# Patient Record
Sex: Female | Born: 1990 | Race: Asian | Hispanic: No | Marital: Married | Smoking: Never smoker
Health system: Southern US, Community
[De-identification: ages and names within clinical notes are randomized; demographics above are authoritative.]

## PROBLEM LIST (undated history)

## (undated) DIAGNOSIS — Z789 Other specified health status: Secondary | ICD-10-CM

## (undated) HISTORY — PX: NO PAST SURGERIES: SHX2092

## (undated) HISTORY — DX: Other specified health status: Z78.9

---

## 2015-09-05 LAB — OB RESULTS CONSOLE RPR: RPR: NONREACTIVE

## 2015-09-05 LAB — OB RESULTS CONSOLE ABO/RH: RH Type: POSITIVE

## 2015-09-05 LAB — CYTOLOGY - PAP: PAP SMEAR: NEGATIVE

## 2015-09-05 LAB — CYSTIC FIBROSIS DIAGNOSTIC STUDY: INTERPRETATION-CFDNA: NEGATIVE

## 2015-09-05 LAB — OB RESULTS CONSOLE GC/CHLAMYDIA
CHLAMYDIA, DNA PROBE: NEGATIVE
Gonorrhea: NEGATIVE

## 2015-09-05 LAB — GLUCOSE TOLERANCE, 1 HOUR: GLUCOSE 1 HOUR GTT: 98

## 2015-09-05 LAB — OB RESULTS CONSOLE ANTIBODY SCREEN: ANTIBODY SCREEN: NEGATIVE

## 2015-09-05 LAB — OB RESULTS CONSOLE HIV ANTIBODY (ROUTINE TESTING): HIV: NONREACTIVE

## 2015-09-05 LAB — OB RESULTS CONSOLE HEPATITIS B SURFACE ANTIGEN: HEP B S AG: NEGATIVE

## 2015-09-05 LAB — OB RESULTS CONSOLE PLATELET COUNT: Platelets: 255 10*3/uL

## 2015-09-05 LAB — SICKLE CELL SCREEN: SICKLE CELL SCREEN: NEGATIVE

## 2015-09-05 LAB — CULTURE, OB URINE: URINE CULTURE, OB: NEGATIVE

## 2015-09-05 LAB — OB RESULTS CONSOLE RUBELLA ANTIBODY, IGM: Rubella: IMMUNE

## 2015-09-05 LAB — OB RESULTS CONSOLE HGB/HCT, BLOOD
HCT: 37 %
HEMOGLOBIN: 12.7 g/dL

## 2015-09-05 LAB — OB RESULTS CONSOLE VARICELLA ZOSTER ANTIBODY, IGG: VARICELLA IGG: NON-IMMUNE/NOT IMMUNE

## 2015-10-11 ENCOUNTER — Encounter: Payer: Self-pay | Admitting: *Deleted

## 2015-10-11 LAB — AMB REFERRAL TO OB-GYN: PAP SMEAR: NEGATIVE

## 2015-10-16 ENCOUNTER — Ambulatory Visit (INDEPENDENT_AMBULATORY_CARE_PROVIDER_SITE_OTHER): Payer: Self-pay | Admitting: Family Medicine

## 2015-10-16 ENCOUNTER — Encounter: Payer: Self-pay | Admitting: Family Medicine

## 2015-10-16 VITALS — BP 125/64 | HR 86 | Ht 61.0 in | Wt 115.0 lb

## 2015-10-16 DIAGNOSIS — O30049 Twin pregnancy, dichorionic/diamniotic, unspecified trimester: Secondary | ICD-10-CM

## 2015-10-16 DIAGNOSIS — O0990 Supervision of high risk pregnancy, unspecified, unspecified trimester: Secondary | ICD-10-CM | POA: Insufficient documentation

## 2015-10-16 DIAGNOSIS — O0992 Supervision of high risk pregnancy, unspecified, second trimester: Secondary | ICD-10-CM

## 2015-10-16 DIAGNOSIS — O30042 Twin pregnancy, dichorionic/diamniotic, second trimester: Secondary | ICD-10-CM

## 2015-10-16 LAB — POCT URINALYSIS DIP (DEVICE)
Bilirubin Urine: NEGATIVE
GLUCOSE, UA: NEGATIVE mg/dL
Hgb urine dipstick: NEGATIVE
Ketones, ur: NEGATIVE mg/dL
LEUKOCYTES UA: NEGATIVE
NITRITE: NEGATIVE
PROTEIN: NEGATIVE mg/dL
SPECIFIC GRAVITY, URINE: 1.02 (ref 1.005–1.030)
UROBILINOGEN UA: 0.2 mg/dL (ref 0.0–1.0)
pH: 6.5 (ref 5.0–8.0)

## 2015-10-16 NOTE — Progress Notes (Signed)
New OB Note  10/16/2015   Clinic:  [facility]  Chief Complaint:  None  Transfer of Care Patient: yes  History of Present Illness: Jenna Walton is a 25 y.o. G1P0000 @ 20 1/7 weeks (EDC 03/03/16, based on LMP.  Patient's last menstrual period was 05/28/2015 (exact date).), with the above CC. Preg complicated by twin gestation.  Her periods were: regular periods every 28-30 days and last menstrual period 05/28/2015 She was using no method when she conceived.  She has Negative signs or symptoms of nausea/vomiting of pregnancy. She has Negative signs or symptoms of miscarriage or preterm labor She identifies recent move from [place], 5 months ago, discussed Zika risk factors for her and her partner  ROS: A 12-point review of systems was performed and negative, except as stated in the above HPI.  OBGYN History: As per HPI. OB History  Gravida Para Term Preterm AB Living  1 0 0 0 0 0  SAB TAB Ectopic Multiple Live Births  0 0 0 0 0    # Outcome Date GA Lbr Len/2nd Weight Sex Delivery Anes PTL Lv  1 Current               Any prior children are healthy, doing well, without any problems or issues: not applicable History of pap smears: Yes. Last pap smear 09/05/2015. Abnormal: no. History of STIs: No   Past Medical History: Past Medical History:  Diagnosis Date  . Medical history non-contributory     Past Surgical History: Past Surgical History:  Procedure Laterality Date  . NO PAST SURGERIES      Family History:  History reviewed. No pertinent family history. She denies any female cancers, bleeding or blood clotting disorders.  She denies any history of mental retardation, birth defects or genetic disorders in her or the FOB's history  Social History:  Social History   Social History  . Marital status: Married    Spouse name: N/A  . Number of children: N/A  . Years of education: N/A   Occupational History  . Not on file.   Social History Main Topics  . Smoking status: Never  Smoker  . Smokeless tobacco: Never Used  . Alcohol use No  . Drug use: No  . Sexual activity: Yes   Other Topics Concern  . Not on file   Social History Narrative  . No narrative on file   Any pets in the household: no   Allergy: No Known Allergies  Health Maintenance:  Mammogram Up to Date: not applicable  Current Outpatient Medications: Prenatal Vitamins  Physical Exam:   BP 125/64   Pulse 86   Ht  (1.549 m)   Wt 115 lb (52.2 kg)   LMP 05/28/2015 (Exact Date)   BMI 21.73 kg/m  Body mass index is 21.73 kg/m. Fundal height: 24 cm FHTs: A -147; B -138  General appearancS1, S2 normal, no murmur, rub or gallop, regular rate and rhythme: Well nourished, well developed female in no acute distress.  Neck:  Supple, normal appearance, and no thyromegaly  Cardiovascular:  Respiratory:  Clear to auscultation bilateral. Normal respiratory effort Abdomen: positive bowel sounds and no masses, hernias; diffusely non tender to palpation, non distended Breasts: breasts appear normal, no suspicious masses, no skin or nipple changes or axillary nodes. Neuro/Psych:  Normal mood and affect.  Skin:  Warm and dry.  Lymphatic:  No inguinal lymphadenopathy.   Pelvic exam: is not limited by body habitus EGBUS: within normal limits  Cervix: closed/long/high Uterus:  enlarged, c/w 20 week size, normal contour, and Adnexa:  normal adnexa  Laboratory: Please see chart  Imaging:  Found on pages 29 and 30 of the transfer packet DIAMNIOTIC/DICHORIONIC TWINS at gestational age 318 2/7 (A) - 5/7 (B) weeks ; EDD 03/07/16 (A) & 03/04/16 (B) LOW LYING PLACENTAS (BOTH) WITHIN 1 CM FROM CERVICAL OS Baby A- EFW 225.78g / 12.2%; Nuchal thickness 2mm; Normal anatomy - girl; Cephalic Baby B- EFW 258.49g / 42.3%; Nuchal thickness 3mm; Normal anatomy - girl ; Cephalic   Assessment/Plan:  1. Supervision of high risk pregnancy, antepartum, second trimester - POCT urinalysis dip (device) - OB  RESULTS CONSOLE GC/Chlamydia - OB RESULTS CONSOLE RPR - OB RESULTS CONSOLE HIV antibody - OB RESULTS CONSOLE Rubella Antibody - OB RESULTS CONSOLE Varicella zoster antibody, IgG - OB RESULTS CONSOLE Hepatitis B surface antigen - OB RESULTS CONSOLE ABO/Rh - OB RESULTS CONSOLE Antibody Screen - OB RESULTS CONSOLE Hemoglobin and hematocrit, blood - OB RESULTS CONSOLE PLATELET COUNT - Sickle cell screen - Cystic fibrosis diagnostic study - Glucose tolerance, 1 hour - Culture, OB Urine - Cytology - PAP - Prenatal Vit-Fe Fumarate-FA (MULTIVITAMIN-PRENATAL) 27-0.8 MG TABS tablet; Take 1 tablet by mouth daily at 12 noon. - AFP/Quad Scr  2. Dichorionic diamniotic twin pregancy - Repeat US at 24 weeks   Problem list reviewed and updated.  Follow up in 2 weeks.  >50% of 30 min visit spent on counseling and coordination of care.     Jenna ClarksElizabeth W. Mumaw, DO [facility] Fellow [facility](Faculty Practice)

## 2015-10-16 NOTE — Progress Notes (Signed)
Elenor LegatoWier Siu used for interpreter

## 2015-10-28 ENCOUNTER — Ambulatory Visit (INDEPENDENT_AMBULATORY_CARE_PROVIDER_SITE_OTHER): Payer: Self-pay | Admitting: Obstetrics & Gynecology

## 2015-10-28 VITALS — BP 108/60 | HR 79 | Wt 117.0 lb

## 2015-10-28 DIAGNOSIS — O30002 Twin pregnancy, unspecified number of placenta and unspecified number of amniotic sacs, second trimester: Secondary | ICD-10-CM

## 2015-10-28 DIAGNOSIS — O30009 Twin pregnancy, unspecified number of placenta and unspecified number of amniotic sacs, unspecified trimester: Secondary | ICD-10-CM

## 2015-10-28 LAB — POCT URINALYSIS DIP (DEVICE)
Bilirubin Urine: NEGATIVE
Glucose, UA: NEGATIVE mg/dL
HGB URINE DIPSTICK: NEGATIVE
Ketones, ur: NEGATIVE mg/dL
LEUKOCYTES UA: NEGATIVE
NITRITE: NEGATIVE
PH: 5.5 (ref 5.0–8.0)
Protein, ur: NEGATIVE mg/dL
SPECIFIC GRAVITY, URINE: 1.01 (ref 1.005–1.030)
UROBILINOGEN UA: 0.2 mg/dL (ref 0.0–1.0)

## 2015-10-28 NOTE — Patient Instructions (Signed)
Second Trimester of Pregnancy  The second trimester is from week 13 through week 28, month 4 through 6. This is often the time in pregnancy that you feel your best. Often times, morning sickness has lessened or quit. You may have more energy, and you may get hungry more often. Your unborn baby (fetus) is growing rapidly. At the end of the sixth month, he or she is about 9 inches long and weighs about 1½ pounds. You will likely feel the baby move (quickening) between 18 and 20 weeks of pregnancy.  HOME CARE   · Avoid all smoking, herbs, and alcohol. Avoid drugs not approved by your doctor.  · Do not use any tobacco products, including cigarettes, chewing tobacco, and electronic cigarettes. If you need help quitting, ask your doctor. You may get counseling or other support to help you quit.  · Only take medicine as told by your doctor. Some medicines are safe and some are not during pregnancy.  · Exercise only as told by your doctor. Stop exercising if you start having cramps.  · Eat regular, healthy meals.  · Wear a good support bra if your breasts are tender.  · Do not use hot tubs, steam rooms, or saunas.  · Wear your seat belt when driving.  · Avoid raw meat, uncooked cheese, and liter boxes and soil used by cats.  · Take your prenatal vitamins.  · Take 1500-2000 milligrams of calcium daily starting at the 20th week of pregnancy until you deliver your baby.  · Try taking medicine that helps you poop (stool softener) as needed, and if your doctor approves. Eat more fiber by eating fresh fruit, vegetables, and whole grains. Drink enough fluids to keep your pee (urine) clear or pale yellow.  · Take warm water baths (sitz baths) to soothe pain or discomfort caused by hemorrhoids. Use hemorrhoid cream if your doctor approves.  · If you have puffy, bulging veins (varicose veins), wear support hose. Raise (elevate) your feet for 15 minutes, 3-4 times a day. Limit salt in your diet.  · Avoid heavy lifting, wear low heals,  and sit up straight.  · Rest with your legs raised if you have leg cramps or low back pain.  · Visit your dentist if you have not gone during your pregnancy. Use a soft toothbrush to brush your teeth. Be gentle when you floss.  · You can have sex (intercourse) unless your doctor tells you not to.  · Go to your doctor visits.  GET HELP IF:   · You feel dizzy.  · You have mild cramps or pressure in your lower belly (abdomen).  · You have a nagging pain in your belly area.  · You continue to feel sick to your stomach (nauseous), throw up (vomit), or have watery poop (diarrhea).  · You have bad smelling fluid coming from your vagina.  · You have pain with peeing (urination).  GET HELP RIGHT AWAY IF:   · You have a fever.  · You are leaking fluid from your vagina.  · You have spotting or bleeding from your vagina.  · You have severe belly cramping or pain.  · You lose or gain weight rapidly.  · You have trouble catching your breath and have chest pain.  · You notice sudden or extreme puffiness (swelling) of your face, hands, ankles, feet, or legs.  · You have not felt the baby move in over an hour.  · You have severe headaches that do   not go away with medicine.  · You have vision changes.     This information is not intended to replace advice given to you by your health care provider. Make sure you discuss any questions you have with your health care provider.     Document Released: 05/20/2009 Document Revised: 03/16/2014 Document Reviewed: 04/26/2012  Elsevier Interactive Patient Education ©2016 Else[employer] Inc[employer]

## 2015-10-28 NOTE — Patient Instructions (Signed)
Multiple Pregnancy °Having a multiple pregnancy means you are carrying twins, triplets, or more. The majority of multiple pregnancies are twins. Naturally conceiving triplets or more (higher-order multiples) is quite rare. °Multiple pregnancies can sometimes be riskier than single pregnancies. You are more likely to have certain problems. For example, you are at higher risk for high blood pressure during pregnancy (preeclampsia). You may need to have more frequent appointments for prenatal care.  °CAUSES  °Sometimes your body releases more than one egg at a time. Having more than one egg fertilized by more than one sperm is the most common type of multiple pregnancy. Twins produced this way are fraternal. They are no more alike than other siblings. °Multiple pregnancies also result when one sperm fertilizes one egg and then that egg divides into more than one embryo. These are identical twins or triplets. Identical multiples are always the same gender and look very much alike. °RISK FACTORS °You may be at higher risk for having a multiple pregnancy if: °· You are older than 35. °· You have already had four or more children. °· You have a family history of multiple pregnancy. °· You have had fertility treatment. °SIGNS AND SYMPTOMS °Early signs and symptoms of multiple pregnancy include: °· Rapid weight gain in the first 3 months of pregnancy (first trimester). °· The uterus measuring larger than normal for your stage of pregnancy. °· More severe nausea. °· A higher-than-normal level of human chorionic gonadotropin (HCG). This is a hormone that your body produces in early pregnancy. °DIAGNOSIS  °Your health care provider may suspect a multiple pregnancy from your symptoms and a physical exam. The results of your HCG blood test may also suggest a multiple pregnancy. Your health care provider will confirm that you are carrying multiples by doing an ultrasound exam. This exam involves using sound waves and a computer to  get an image of the inside of your uterus. An ultrasound exam can confirm a multiple pregnancy after you have been pregnant for 6-8 weeks. °TREATMENT  °If you have a multiple pregnancy, you may need: °· Early screening for possible birth defects or genetic abnormalities. °· Frequent pelvic exams to check for signs of early labor. °· Frequent ultrasound exams during the second trimester. °· Frequent checks for high blood pressure and for protein in your urine. These are signs of preeclampsia. °· An anti-inflammatory medicine (corticosteroid) if you go into early labor. This medicine helps your babies' lungs mature. °· Magnesium sulfate to prevent cerebral palsy in your babies if you are expected to deliver before 32 weeks. °· Cesarean delivery if vaginal delivery is too dangerous. °HOME CARE INSTRUCTIONS  °Because your pregnancy may be considered high risk, you have to work closely with your health care team. You may also need to make some lifestyle changes. These may include the following: °· Increase your nutrition. °¨ Gaining about 40-50 lb (18-23 kg) is recommended when you are pregnant with multiples. Try to gain 1 lb (0.5 kg) a week for the first 20 weeks of your pregnancy. °¨ Your health care provider will let you know how many calories you should add to your diet each day. °¨ Eat healthy snacks often throughout the day. This can add calories and reduce nausea. °· Drink enough fluid to keep your urine clear or pale yellow. °· Take your prenatal vitamins. °· Take 1500-2000 mg of calcium daily starting at the 20th week of pregnancy until you deliver your babies. °· By 20-24 weeks, you may need to   limit your activities. °¨ Avoid strenuous activity and work. °¨ Ask your health care provider when you should stop having sexual intercourse. °¨ Rest often. °· Do not smoke. °· Do not drink alcohol. °· Arrange for extra help around the house. °· Keep all your prenatal appointments. °SEEK MEDICAL CARE IF: °· You have  dizziness. °· You have mild pelvic cramps, pelvic pressure, or nagging pain in the abdominal or low back area. °· You have persistent nausea, vomiting, or diarrhea. °· You have a bad smelling vaginal discharge. °· You have pain with urination. °· You are having trouble gaining weight. °· You notice increased swelling in your face, hands, legs, or ankles. °· You have a fever. °SEEK IMMEDIATE MEDICAL CARE IF:  °· You are leaking fluid from your vagina. °· You have spotting or bleeding from your vagina. °· You have severe abdominal cramping or pain. °· You have rapid weight gain or loss. °· You vomit blood or material that looks like coffee grounds. °· You are exposed to German measles and have never had them. °· You are exposed to fifth disease or chickenpox. °· You develop a severe headache. °· You have shortness of breath. °  °This information is not intended to replace advice given to you by your health care provider. Make sure you discuss any questions you have with your health care provider. °  °Document Released: 12/03/2007 Document Revised: 03/16/2014 Document Reviewed: 01/27/2013 °Elsevier Interactive Patient Education ©2016 Elsevier Inc. ° °

## 2015-10-28 NOTE — Progress Notes (Signed)
[place] interpreter [phone] used

## 2015-10-28 NOTE — Progress Notes (Signed)
Subjective:  Jenna Walton is a 25 y.o. G1P0000 at 7412w6d being seen today for ongoing prenatal care.  She is currently monitored for the following issues for this high-risk pregnancy and has Twin pregnancy, antepartum and Supervision of high risk pregnancy, antepartum on her problem list.  Patient reports no complaints.  Contractions: Not present. Vag. Bleeding: None.  Movement: Present. Denies leaking of fluid.   The following portions of the patient's history were reviewed and updated as appropriate: allergies, current medications, past family history, past medical history, past social history, past surgical history and problem list. Problem list updated.  Objective:   Vitals:   10/28/15 0854  BP: 108/60  Pulse: 79  Weight: 117 lb (53.1 kg)    Fetal Status: Fetal Heart Rate (bpm): 143/143   Movement: Present     General:  Alert, oriented and cooperative. Patient is in no acute distress.  Skin: Skin is warm and dry. No rash noted.   Cardiovascular: Normal heart rate noted  Respiratory: Normal respiratory effort, no problems with respiration noted  Abdomen: Soft, gravid, appropriate for gestational age. Pain/Pressure: Present     Pelvic:  Cervical exam deferred        Extremities: Normal range of motion.  Edema: None  Mental Status: Normal mood and affect. Normal behavior. Normal judgment and thought content.   Urinalysis:      Assessment and Plan:  Pregnancy: G1P0000 at 3912w6d  1. Twin pregnancy, antepartum, unspecified multiple gestation type Needs [place] result   Preterm labor symptoms and general obstetric precautions including but not limited to vaginal bleeding, contractions, leaking of fluid and fetal movement were reviewed in detail with the patient. Please refer to After Visit Summary for other counseling recommendations.  Return in about 4 weeks (around 11/25/2015).    Adam PhenixJames G Maeven Mcdougall, MD

## 2015-11-04 ENCOUNTER — Ambulatory Visit (HOSPITAL_COMMUNITY)
Admission: RE | Admit: 2015-11-04 | Discharge: 2015-11-04 | Disposition: A | Discharge status: Home / Self Care | Payer: Medicaid Other | Source: Ambulatory Visit | Attending: Obstetrics & Gynecology | Admitting: Obstetrics & Gynecology

## 2015-11-04 DIAGNOSIS — O30042 Twin pregnancy, dichorionic/diamniotic, second trimester: Secondary | ICD-10-CM | POA: Insufficient documentation

## 2015-11-04 DIAGNOSIS — Z3A22 22 weeks gestation of pregnancy: Secondary | ICD-10-CM | POA: Diagnosis not present

## 2015-11-04 DIAGNOSIS — Z36 Encounter for antenatal screening of mother: Secondary | ICD-10-CM | POA: Insufficient documentation

## 2015-11-04 DIAGNOSIS — O30009 Twin pregnancy, unspecified number of placenta and unspecified number of amniotic sacs, unspecified trimester: Secondary | ICD-10-CM

## 2015-12-02 ENCOUNTER — Ambulatory Visit (INDEPENDENT_AMBULATORY_CARE_PROVIDER_SITE_OTHER): Payer: Medicaid Other | Admitting: Family Medicine

## 2015-12-02 VITALS — BP 101/65 | HR 85 | Wt 123.1 lb

## 2015-12-02 DIAGNOSIS — O30042 Twin pregnancy, dichorionic/diamniotic, second trimester: Secondary | ICD-10-CM

## 2015-12-02 DIAGNOSIS — O30049 Twin pregnancy, dichorionic/diamniotic, unspecified trimester: Secondary | ICD-10-CM

## 2015-12-02 DIAGNOSIS — Z23 Encounter for immunization: Secondary | ICD-10-CM | POA: Diagnosis not present

## 2015-12-02 DIAGNOSIS — O0992 Supervision of high risk pregnancy, unspecified, second trimester: Secondary | ICD-10-CM

## 2015-12-02 LAB — CBC
HEMATOCRIT: 31 % — AB (ref 35.0–45.0)
Hemoglobin: 10 g/dL — ABNORMAL LOW (ref 11.7–15.5)
MCH: 30.5 pg (ref 27.0–33.0)
MCHC: 32.3 g/dL (ref 32.0–36.0)
MCV: 94.5 fL (ref 80.0–100.0)
MPV: 9 fL (ref 7.5–12.5)
PLATELETS: 308 10*3/uL (ref 140–400)
RBC: 3.28 MIL/uL — ABNORMAL LOW (ref 3.80–5.10)
RDW: 13.1 % (ref 11.0–15.0)
WBC: 9.5 10*3/uL (ref 3.8–10.8)

## 2015-12-02 LAB — HIV ANTIBODY (ROUTINE TESTING W REFLEX): HIV: NONREACTIVE

## 2015-12-02 LAB — GLUCOSE TOLERANCE, 1 HOUR (50G) W/O FASTING: GLUCOSE, 1 HR, GESTATIONAL: 101 mg/dL (ref <140)

## 2015-12-02 NOTE — Progress Notes (Signed)
   PRENATAL VISIT NOTE  Subjective:  Jenna Walton is a 25 y.o. G1P0000 at 4988w6d being seen today for ongoing prenatal care.  She is currently monitored for the following issues for this high-risk pregnancy and has Dichorionic diamniotic twin pregnancy and Supervision of high risk pregnancy, antepartum on her problem list.  Patient reports no complaints.  Contractions: Not present. Vag. Bleeding: None.  Movement: Present. Denies leaking of fluid.   The following portions of the patient's history were reviewed and updated as appropriate: allergies, current medications, past family history, past medical history, past social history, past surgical history and problem list. Problem list updated.  Objective:   Vitals:   12/02/15 0831  BP: 101/65  Pulse: 85  Weight: 123 lb 1.6 oz (55.8 kg)    Fetal Status: Fetal Heart Rate (bpm): 140/146 Fundal Height: 31 cm Movement: Present     General:  Alert, oriented and cooperative. Patient is in no acute distress.  Skin: Skin is warm and dry. No rash noted.   Cardiovascular: Normal heart rate noted  Respiratory: Normal respiratory effort, no problems with respiration noted  Abdomen: Soft, gravid, appropriate for gestational age. Pain/Pressure: Absent     Pelvic:  Cervical exam deferred        Extremities: Normal range of motion.  Edema: None  Mental Status: Normal mood and affect. Normal behavior. Normal judgment and thought content.   8/28 U/S A breech 1 lb 2 oz (43%), nml fluid B variable 1 lb 1 oz (31%), nml fluid 6% discordance Cervix 3.6 cm  Assessment and Plan:  Pregnancy: G1P0000 at 8488w6d  1. Supervision of high risk pregnancy, antepartum, second trimester Continue prenatal care. 28 wk labs Declines flu  - Tdap vaccine greater than or equal to 7yo IM - CBC - RPR - HIV antibody (with reflex) - Glucose Tolerance, 1 HR (50g) w/o Fasting  2. Dichorionic diamniotic twin pregnancy Needs f/u for growth - US MFM OB FOLLOW UP; Future -  US MFM OB FOLLOW UP ADDL GEST; Future  Preterm labor symptoms and general obstetric precautions including but not limited to vaginal bleeding, contractions, leaking of fluid and fetal movement were reviewed in detail with the patient. Please refer to After Visit Summary for other counseling recommendations.  Return in 4 weeks (on 12/30/2015).  Reva Boresanya S Pratt, MD

## 2015-12-02 NOTE — Progress Notes (Signed)
Glucose test today due at 0936. Pt refuses flu vaccine, okay for tdap.

## 2015-12-02 NOTE — Patient Instructions (Signed)
Breastfeeding Deciding to breastfeed is one of the best choices you can make for you and your baby. A change in hormones during pregnancy causes your breast tissue to grow and increases the number and size of your milk ducts. These hormones also allow proteins, sugars, and fats from your blood supply to make breast milk in your milk-producing glands. Hormones prevent breast milk from being released before your baby is born as well as prompt milk flow after birth. Once breastfeeding has begun, thoughts of your baby, as well as his or her sucking or crying, can stimulate the release of milk from your milk-producing glands.  BENEFITS OF BREASTFEEDING For Your Baby  Your first milk (colostrum) helps your baby's digestive system function better.  There are antibodies in your milk that help your baby fight off infections.  Your baby has a lower incidence of asthma, allergies, and sudden infant death syndrome.  The nutrients in breast milk are better for your baby than infant formulas and are designed uniquely for your baby's needs.  Breast milk improves your baby's brain development.  Your baby is less likely to develop other conditions, such as childhood obesity, asthma, or type 2 diabetes mellitus. For You  Breastfeeding helps to create a very special bond between you and your baby.  Breastfeeding is convenient. Breast milk is always available at the correct temperature and costs nothing.  Breastfeeding helps to burn calories and helps you lose the weight gained during pregnancy.  Breastfeeding makes your uterus contract to its prepregnancy size faster and slows bleeding (lochia) after you give birth.   Breastfeeding helps to lower your risk of developing type 2 diabetes mellitus, osteoporosis, and breast or ovarian cancer later in life. SIGNS THAT YOUR BABY IS HUNGRY Early Signs of Hunger  Increased alertness or activity.  Stretching.  Movement of the head from side to  side.  Movement of the head and opening of the mouth when the corner of the mouth or cheek is stroked (rooting).  Increased sucking sounds, smacking lips, cooing, sighing, or squeaking.  Hand-to-mouth movements.  Increased sucking of fingers or hands. Late Signs of Hunger  Fussing.  Intermittent crying. Extreme Signs of Hunger Signs of extreme hunger will require calming and consoling before your baby will be able to breastfeed successfully. Do not wait for the following signs of extreme hunger to occur before you initiate breastfeeding:  Restlessness.  A loud, strong cry.  Screaming. BREASTFEEDING BASICS Breastfeeding Initiation  Find a comfortable place to sit or lie down, with your neck and back well supported.  Place a pillow or rolled up blanket under your baby to bring him or her to the level of your breast (if you are seated). Nursing pillows are specially designed to help support your arms and your baby while you breastfeed.  Make sure that your baby's abdomen is facing your abdomen.  Gently massage your breast. With your fingertips, massage from your chest wall toward your nipple in a circular motion. This encourages milk flow. You may need to continue this action during the feeding if your milk flows slowly.  Support your breast with 4 fingers underneath and your thumb above your nipple. Make sure your fingers are well away from your nipple and your baby's mouth.  Stroke your baby's lips gently with your finger or nipple.  When your baby's mouth is open wide enough, quickly bring your baby to your breast, placing your entire nipple and as much of the colored area around your nipple (  areola) as possible into your baby's mouth.  More areola should be visible above your baby's upper lip than below the lower lip.  Your baby's tongue should be between his or her lower gum and your breast.  Ensure that your baby's mouth is correctly positioned around your nipple  (latched). Your baby's lips should create a seal on your breast and be turned out (everted).  It is common for your baby to suck about 2-3 minutes in order to start the flow of breast milk. Latching Teaching your baby how to latch on to your breast properly is very important. An improper latch can cause nipple pain and decreased milk supply for you and poor weight gain in your baby. Also, if your baby is not latched onto your nipple properly, he or she may swallow some air during feeding. This can make your baby fussy. Burping your baby when you switch breasts during the feeding can help to get rid of the air. However, teaching your baby to latch on properly is still the best way to prevent fussiness from swallowing air while breastfeeding. Signs that your baby has successfully latched on to your nipple:  Silent tugging or silent sucking, without causing you pain.  Swallowing heard between every 3-4 sucks.  Muscle movement above and in front of his or her ears while sucking. Signs that your baby has not successfully latched on to nipple:  Sucking sounds or smacking sounds from your baby while breastfeeding.  Nipple pain. If you think your baby has not latched on correctly, slip your finger into the corner of your baby's mouth to break the suction and place it between your baby's gums. Attempt breastfeeding initiation again. Signs of Successful Breastfeeding Signs from your baby:  A gradual decrease in the number of sucks or complete cessation of sucking.  Falling asleep.  Relaxation of his or her body.  Retention of a small amount of milk in his or her mouth.  Letting go of your breast by himself or herself. Signs from you:  Breasts that have increased in firmness, weight, and size 1-3 hours after feeding.  Breasts that are softer immediately after breastfeeding.  Increased milk volume, as well as a change in milk consistency and color by the fifth day of breastfeeding.  Nipples  that are not sore, cracked, or bleeding. Signs That Your Baby is Getting Enough Milk  Wetting at least 3 diapers in a 24-hour period. The urine should be clear and pale yellow by age 5 days.  At least 3 stools in a 24-hour period by age 5 days. The stool should be soft and yellow.  At least 3 stools in a 24-hour period by age 7 days. The stool should be seedy and yellow.  No loss of weight greater than 10% of birth weight during the first 3 days of age.  Average weight gain of 4-7 ounces (113-198 g) per week after age 4 days.  Consistent daily weight gain by age 5 days, without weight loss after the age of 2 weeks. After a feeding, your baby may spit up a small amount. This is common. BREASTFEEDING FREQUENCY AND DURATION Frequent feeding will help you make more milk and can prevent sore nipples and breast engorgement. Breastfeed when you feel the need to reduce the fullness of your breasts or when your baby shows signs of hunger. This is called "breastfeeding on demand." Avoid introducing a pacifier to your baby while you are working to establish breastfeeding (the first 4-6 weeks   after your baby is born). After this time you may choose to use a pacifier. Research has shown that pacifier use during the first year of a baby's life decreases the risk of sudden infant death syndrome (SIDS). Allow your baby to feed on each breast as long as he or she wants. Breastfeed until your baby is finished feeding. When your baby unlatches or falls asleep while feeding from the first breast, offer the second breast. Because newborns are often sleepy in the first few weeks of life, you may need to awaken your baby to get him or her to feed. Breastfeeding times will vary from baby to baby. However, the following rules can serve as a guide to help you ensure that your baby is properly fed:  Newborns (babies 4 weeks of age or younger) may breastfeed every 1-3 hours.  Newborns should not go longer than 3 hours  during the day or 5 hours during the night without breastfeeding.  You should breastfeed your baby a minimum of 8 times in a 24-hour period until you begin to introduce solid foods to your baby at around 6 months of age. BREAST MILK PUMPING Pumping and storing breast milk allows you to ensure that your baby is exclusively fed your breast milk, even at times when you are unable to breastfeed. This is especially important if you are going back to work while you are still breastfeeding or when you are not able to be present during feedings. Your lactation consultant can give you guidelines on how long it is safe to store breast milk. A breast pump is a machine that allows you to pump milk from your breast into a sterile bottle. The pumped breast milk can then be stored in a refrigerator or freezer. Some breast pumps are operated by hand, while others use electricity. Ask your lactation consultant which type will work best for you. Breast pumps can be purchased, but some hospitals and breastfeeding support groups lease breast pumps on a monthly basis. A lactation consultant can teach you how to hand express breast milk, if you prefer not to use a pump. CARING FOR YOUR BREASTS WHILE YOU BREASTFEED Nipples can become dry, cracked, and sore while breastfeeding. The following recommendations can help keep your breasts moisturized and healthy:  Avoid using soap on your nipples.  Wear a supportive bra. Although not required, special nursing bras and tank tops are designed to allow access to your breasts for breastfeeding without taking off your entire bra or top. Avoid wearing underwire-style bras or extremely tight bras.  Air dry your nipples for 3-4minutes after each feeding.  Use only cotton bra pads to absorb leaked breast milk. Leaking of breast milk between feedings is normal.  Use lanolin on your nipples after breastfeeding. Lanolin helps to maintain your skin's normal moisture barrier. If you use  pure lanolin, you do not need to wash it off before feeding your baby again. Pure lanolin is not toxic to your baby. You may also hand express a few drops of breast milk and gently massage that milk into your nipples and allow the milk to air dry. In the first few weeks after giving birth, some women experience extremely full breasts (engorgement). Engorgement can make your breasts feel heavy, warm, and tender to the touch. Engorgement peaks within 3-5 days after you give birth. The following recommendations can help ease engorgement:  Completely empty your breasts while breastfeeding or pumping. You may want to start by applying warm, moist heat (in   the shower or with warm water-soaked hand towels) just before feeding or pumping. This increases circulation and helps the milk flow. If your baby does not completely empty your breasts while breastfeeding, pump any extra milk after he or she is finished.  Wear a snug bra (nursing or regular) or tank top for 1-2 days to signal your body to slightly decrease milk production.  Apply ice packs to your breasts, unless this is too uncomfortable for you.  Make sure that your baby is latched on and positioned properly while breastfeeding. If engorgement persists after 48 hours of following these recommendations, contact your health care provider or a lactation consultant. OVERALL HEALTH CARE RECOMMENDATIONS WHILE BREASTFEEDING  Eat healthy foods. Alternate between meals and snacks, eating 3 of each per day. Because what you eat affects your breast milk, some of the foods may make your baby more irritable than usual. Avoid eating these foods if you are sure that they are negatively affecting your baby.  Drink milk, fruit juice, and water to satisfy your thirst (about 10 glasses a day).  Rest often, relax, and continue to take your prenatal vitamins to prevent fatigue, stress, and anemia.  Continue breast self-awareness checks.  Avoid chewing and smoking  tobacco. Chemicals from cigarettes that pass into breast milk and exposure to secondhand smoke may harm your baby.  Avoid alcohol and drug use, including marijuana. Some medicines that may be harmful to your baby can pass through breast milk. It is important to ask your health care provider before taking any medicine, including all over-the-counter and prescription medicine as well as vitamin and herbal supplements. It is possible to become pregnant while breastfeeding. If birth control is desired, ask your health care provider about options that will be safe for your baby. SEEK MEDICAL CARE IF:  You feel like you want to stop breastfeeding or have become frustrated with breastfeeding.  You have painful breasts or nipples.  Your nipples are cracked or bleeding.  Your breasts are red, tender, or warm.  You have a swollen area on either breast.  You have a fever or chills.  You have nausea or vomiting.  You have drainage other than breast milk from your nipples.  Your breasts do not become full before feedings by the fifth day after you give birth.  You feel sad and depressed.  Your baby is too sleepy to eat well.  Your baby is having trouble sleeping.   Your baby is wetting less than 3 diapers in a 24-hour period.  Your baby has less than 3 stools in a 24-hour period.  Your baby's skin or the white part of his or her eyes becomes yellow.   Your baby is not gaining weight by 5 days of age. SEEK IMMEDIATE MEDICAL CARE IF:  Your baby is overly tired (lethargic) and does not want to wake up and feed.  Your baby develops an unexplained fever.   This information is not intended to replace advice given to you by your health care provider. Make sure you discuss any questions you have with your health care provider.   Document Released: 02/23/2005 Document Revised: 11/14/2014 Document Reviewed: 08/17/2012 Elsevier Interactive Patient Education 2016 [employer]  

## 2015-12-03 LAB — RPR

## 2015-12-11 ENCOUNTER — Other Ambulatory Visit: Payer: Self-pay | Admitting: Family Medicine

## 2015-12-11 ENCOUNTER — Other Ambulatory Visit (HOSPITAL_COMMUNITY): Payer: Self-pay | Admitting: *Deleted

## 2015-12-11 ENCOUNTER — Ambulatory Visit (HOSPITAL_COMMUNITY)
Admission: RE | Admit: 2015-12-11 | Discharge: 2015-12-11 | Disposition: A | Discharge status: Home / Self Care | Payer: Medicaid Other | Source: Ambulatory Visit | Attending: Family Medicine | Admitting: Family Medicine

## 2015-12-11 DIAGNOSIS — Z3A28 28 weeks gestation of pregnancy: Secondary | ICD-10-CM | POA: Diagnosis not present

## 2015-12-11 DIAGNOSIS — O30049 Twin pregnancy, dichorionic/diamniotic, unspecified trimester: Secondary | ICD-10-CM

## 2015-12-11 DIAGNOSIS — O30042 Twin pregnancy, dichorionic/diamniotic, second trimester: Secondary | ICD-10-CM | POA: Insufficient documentation

## 2015-12-30 ENCOUNTER — Ambulatory Visit (INDEPENDENT_AMBULATORY_CARE_PROVIDER_SITE_OTHER): Payer: Medicaid Other | Admitting: Obstetrics and Gynecology

## 2015-12-30 ENCOUNTER — Other Ambulatory Visit (HOSPITAL_COMMUNITY): Payer: Self-pay | Admitting: Maternal and Fetal Medicine

## 2015-12-30 ENCOUNTER — Ambulatory Visit (HOSPITAL_COMMUNITY)
Admission: RE | Admit: 2015-12-30 | Discharge: 2015-12-30 | Disposition: A | Discharge status: Home / Self Care | Payer: Medicaid Other | Source: Ambulatory Visit | Attending: Family Medicine | Admitting: Family Medicine

## 2015-12-30 ENCOUNTER — Encounter (HOSPITAL_COMMUNITY): Payer: Self-pay

## 2015-12-30 VITALS — BP 95/64 | HR 87 | Wt 128.8 lb

## 2015-12-30 DIAGNOSIS — Z789 Other specified health status: Secondary | ICD-10-CM | POA: Insufficient documentation

## 2015-12-30 DIAGNOSIS — Z3A30 30 weeks gestation of pregnancy: Secondary | ICD-10-CM | POA: Diagnosis not present

## 2015-12-30 DIAGNOSIS — Z362 Encounter for other antenatal screening follow-up: Secondary | ICD-10-CM | POA: Diagnosis present

## 2015-12-30 DIAGNOSIS — O30049 Twin pregnancy, dichorionic/diamniotic, unspecified trimester: Secondary | ICD-10-CM

## 2015-12-30 DIAGNOSIS — O30043 Twin pregnancy, dichorionic/diamniotic, third trimester: Secondary | ICD-10-CM | POA: Insufficient documentation

## 2015-12-30 DIAGNOSIS — Z3689 Encounter for other specified antenatal screening: Secondary | ICD-10-CM

## 2015-12-30 DIAGNOSIS — O0990 Supervision of high risk pregnancy, unspecified, unspecified trimester: Secondary | ICD-10-CM

## 2015-12-30 NOTE — Progress Notes (Signed)
[place] interpreter [phone] used

## 2015-12-30 NOTE — Progress Notes (Signed)
Prenatal Visit Note Date: 12/30/2015 [facility]  Subjective:  Jenna Walton is a 25 y.o. G1P0000 at 442w6d being seen today for ongoing prenatal care.  She is currently monitored for the following issues for this high-risk pregnancy and has Dichorionic diamniotic twin pregnancy; Supervision of high risk pregnancy, antepartum; and Language barrier on her problem list.  Patient reports no complaints.   Contractions: Irritability. Vag. Bleeding: None.  Movement: Present. Denies leaking of fluid.   The following portions of the patient's history were reviewed and updated as appropriate: allergies, current medications, past family history, past medical history, past social history, past surgical history and problem list. Problem list updated.  Objective:   Vitals:   12/30/15 0847  BP: 95/64  Pulse: 87  Weight: 128 lb 12.8 oz (58.4 kg)    Fetal Status: Fetal Heart Rate (bpm): 162/138   Movement: Present     General:  Alert, oriented and cooperative. Patient is in no acute distress.  Skin: Skin is warm and dry. No rash noted.   Cardiovascular: Normal heart rate noted  Respiratory: Normal respiratory effort, no problems with respiration noted  Abdomen: Soft, gravid, appropriate for gestational age. Pain/Pressure: Present     Pelvic:  Cervical exam deferred        Extremities: Normal range of motion.  Edema: None  Mental Status: Normal mood and affect. Normal behavior. Normal judgment and thought content.   Urinalysis:      Assessment and Plan:  Pregnancy: G1P0000 at 262w6d  1. Supervision of high risk pregnancy, antepartum Routine care. Normal 28wk labs. Offer tdap and flu shot nv.   2. Dichorionic diamniotic twin pregnancy in third trimester For surveillance growth scan today. Growth scan on 10/4 had AC 9% with normal UA S/D for twin A and normal efw, afi and with normal efw/ac/afi on twin b, with 7% discordance. F/u results D/w pt and husband that will d/w  them more re: delivery mode nv.  3. Language barrier Interpreter used  Preterm labor symptoms and general obstetric precautions including but not limited to vaginal bleeding, contractions, leaking of fluid and fetal movement were reviewed in detail with the patient. Please refer to After Visit Summary for other counseling recommendations.  Return in about 2 weeks (around 01/13/2016).    Bingharlie Aika Brzoska, MD

## 2015-12-31 ENCOUNTER — Other Ambulatory Visit (HOSPITAL_COMMUNITY): Payer: Self-pay | Admitting: *Deleted

## 2015-12-31 DIAGNOSIS — O30049 Twin pregnancy, dichorionic/diamniotic, unspecified trimester: Secondary | ICD-10-CM

## 2016-01-13 ENCOUNTER — Ambulatory Visit (INDEPENDENT_AMBULATORY_CARE_PROVIDER_SITE_OTHER): Payer: Medicaid Other | Admitting: Family Medicine

## 2016-01-13 VITALS — BP 105/61 | HR 92 | Wt 131.0 lb

## 2016-01-13 DIAGNOSIS — O0990 Supervision of high risk pregnancy, unspecified, unspecified trimester: Secondary | ICD-10-CM

## 2016-01-13 DIAGNOSIS — O30043 Twin pregnancy, dichorionic/diamniotic, third trimester: Secondary | ICD-10-CM

## 2016-01-13 NOTE — Progress Notes (Signed)
[place] interpreter [phone]

## 2016-01-13 NOTE — Progress Notes (Signed)
   PRENATAL VISIT NOTE  Subjective:  Jenna Walton is a 25 y.o. G1P0000 at 3574w6d being seen today for ongoing prenatal care.  She is currently monitored for the following issues for this high-risk pregnancy and has Dichorionic diamniotic twin pregnancy; Supervision of high risk pregnancy, antepartum; and Language barrier on her problem list.  Patient reports no complaints.  Contractions: Not present. Vag. Bleeding: None.  Movement: Present. Denies leaking of fluid.   The following portions of the patient's history were reviewed and updated as appropriate: allergies, current medications, past family history, past medical history, past social history, past surgical history and problem list. Problem list updated.  Objective:   Vitals:   01/13/16 0859  BP: 105/61  Pulse: 92  Weight: 131 lb (59.4 kg)    Fetal Status: Fetal Heart Rate (bpm): 144/133 Fundal Height: 39 cm Movement: Present  Presentation: Vertex  General:  Alert, oriented and cooperative. Patient is in no acute distress.  Skin: Skin is warm and dry. No rash noted.   Cardiovascular: Normal heart rate noted  Respiratory: Normal respiratory effort, no problems with respiration noted  Abdomen: Soft, gravid, appropriate for gestational age. Pain/Pressure: Present     Pelvic:  Cervical exam deferred        Extremities: Normal range of motion.  Edema: None  Mental Status: Normal mood and affect. Normal behavior. Normal judgment and thought content.   Assessment and Plan:  Pregnancy: G1P0000 at 2774w6d  1. Supervision of high risk pregnancy, antepartum Continue prenatal care.   2. Dichorionic diamniotic twin pregnancy in third trimester Last u/s on 10/23 shows A vtx, 3 lb 2 oz, 28% B is trv 3 lb 7 oz 44% Has f/u scheduled in 2 wks. No S/Sx's of PTL.  Preterm labor symptoms and general obstetric precautions including but not limited to vaginal bleeding, contractions, leaking of fluid and fetal movement were reviewed in detail with  the patient. Please refer to After Visit Summary for other counseling recommendations.  Return in 2 weeks (on 01/27/2016).   Reva Boresanya S Jordane Hisle, MD

## 2016-01-13 NOTE — Patient Instructions (Addendum)
Third Trimester of Pregnancy The third trimester is from week 29 through week 42, months 7 through 9. The third trimester is a time when the fetus is growing rapidly. At the end of the ninth month, the fetus is about 20 inches in length and weighs 6-10 pounds.  BODY CHANGES Your body goes through many changes during pregnancy. The changes vary from woman to woman.   Your weight will continue to increase. You can expect to gain 25-35 pounds (11-16 kg) by the end of the pregnancy.  You may begin to get stretch marks on your hips, abdomen, and breasts.  You may urinate more often because the fetus is moving lower into your pelvis and pressing on your bladder.  You may develop or continue to have heartburn as a result of your pregnancy.  You may develop constipation because certain hormones are causing the muscles that push waste through your intestines to slow down.  You may develop hemorrhoids or swollen, bulging veins (varicose veins).  You may have pelvic pain because of the weight gain and pregnancy hormones relaxing your joints between the bones in your pelvis. Backaches may result from overexertion of the muscles supporting your posture.  You may have changes in your hair. These can include thickening of your hair, rapid growth, and changes in texture. Some women also have hair loss during or after pregnancy, or hair that feels dry or thin. Your hair will most likely return to normal after your baby is born.  Your breasts will continue to grow and be tender. A yellow discharge may leak from your breasts called colostrum.  Your belly button may stick out.  You may feel short of breath because of your expanding uterus.  You may notice the fetus "dropping," or moving lower in your abdomen.  You may have a bloody mucus discharge. This usually occurs a few days to a week before labor begins.  Your cervix becomes thin and soft (effaced) near your due date. WHAT TO EXPECT AT YOUR  PRENATAL EXAMS  You will have prenatal exams every 2 weeks until week 36. Then, you will have weekly prenatal exams. During a routine prenatal visit:  You will be weighed to make sure you and the fetus are growing normally.  Your blood pressure is taken.  Your abdomen will be measured to track your baby's growth.  The fetal heartbeat will be listened to.  Any test results from the previous visit will be discussed.  You may have a cervical check near your due date to see if you have effaced. At around 36 weeks, your caregiver will check your cervix. At the same time, your caregiver will also perform a test on the secretions of the vaginal tissue. This test is to determine if a type of bacteria, Group B streptococcus, is present. Your caregiver will explain this further. Your caregiver may ask you:  What your birth plan is.  How you are feeling.  If you are feeling the baby move.  If you have had any abnormal symptoms, such as leaking fluid, bleeding, severe headaches, or abdominal cramping.  If you are using any tobacco products, including cigarettes, chewing tobacco, and electronic cigarettes.  If you have any questions. Other tests or screenings that may be performed during your third trimester include:  Blood tests that check for low iron levels (anemia).  Fetal testing to check the health, activity level, and growth of the fetus. Testing is done if you have certain medical conditions or if   there are problems during the pregnancy.  HIV (human immunodeficiency virus) testing. If you are at high risk, you may be screened for HIV during your third trimester of pregnancy. FALSE LABOR You may feel small, irregular contractions that eventually go away. These are called Braxton Hicks contractions, or false labor. Contractions may last for hours, days, or even weeks before true labor sets in. If contractions come at regular intervals, intensify, or become painful, it is best to be seen  by your caregiver.  SIGNS OF LABOR   Menstrual-like cramps.  Contractions that are 5 minutes apart or less.  Contractions that start on the top of the uterus and spread down to the lower abdomen and back.  A sense of increased pelvic pressure or back pain.  A watery or bloody mucus discharge that comes from the vagina. If you have any of these signs before the 37th week of pregnancy, call your caregiver right away. You need to go to the hospital to get checked immediately. HOME CARE INSTRUCTIONS   Avoid all smoking, herbs, alcohol, and unprescribed drugs. These chemicals affect the formation and growth of the baby.  Do not use any tobacco products, including cigarettes, chewing tobacco, and electronic cigarettes. If you need help quitting, ask your health care provider. You may receive counseling support and other resources to help you quit.  Follow your caregiver's instructions regarding medicine use. There are medicines that are either safe or unsafe to take during pregnancy.  Exercise only as directed by your caregiver. Experiencing uterine cramps is a good sign to stop exercising.  Continue to eat regular, healthy meals.  Wear a good support bra for breast tenderness.  Do not use hot tubs, steam rooms, or saunas.  Wear your seat belt at all times when driving.  Avoid raw meat, uncooked cheese, cat litter boxes, and soil used by cats. These carry germs that can cause birth defects in the baby.  Take your prenatal vitamins.  Take 1500-2000 mg of calcium daily starting at the 20th week of pregnancy until you deliver your baby.  Try taking a stool softener (if your caregiver approves) if you develop constipation. Eat more high-fiber foods, such as fresh vegetables or fruit and whole grains. Drink plenty of fluids to keep your urine clear or pale yellow.  Take warm sitz baths to soothe any pain or discomfort caused by hemorrhoids. Use hemorrhoid cream if your caregiver  approves.  If you develop varicose veins, wear support hose. Elevate your feet for 15 minutes, 3-4 times a day. Limit salt in your diet.  Avoid heavy lifting, wear low heal shoes, and practice good posture.  Rest a lot with your legs elevated if you have leg cramps or low back pain.  Visit your dentist if you have not gone during your pregnancy. Use a soft toothbrush to brush your teeth and be gentle when you floss.  A sexual relationship may be continued unless your caregiver directs you otherwise.  Do not travel far distances unless it is absolutely necessary and only with the approval of your caregiver.  Take prenatal classes to understand, practice, and ask questions about the labor and delivery.  Make a trial run to the hospital.  Pack your hospital bag.  Prepare the baby's nursery.  Continue to go to all your prenatal visits as directed by your caregiver. SEEK MEDICAL CARE IF:  You are unsure if you are in labor or if your water has broken.  You have dizziness.  You have   mild pelvic cramps, pelvic pressure, or nagging pain in your abdominal area.  You have persistent nausea, vomiting, or diarrhea.  You have a bad smelling vaginal discharge.  You have pain with urination. SEEK IMMEDIATE MEDICAL CARE IF:   You have a fever.  You are leaking fluid from your vagina.  You have spotting or bleeding from your vagina.  You have severe abdominal cramping or pain.  You have rapid weight loss or gain.  You have shortness of breath with chest pain.  You notice sudden or extreme swelling of your face, hands, ankles, feet, or legs.  You have not felt your baby move in over an hour.  You have severe headaches that do not go away with medicine.  You have vision changes.   This information is not intended to replace advice given to you by your health care provider. Make sure you discuss any questions you have with your health care provider.   Document Released:  02/17/2001 Document Revised: 03/16/2014 Document Reviewed: 04/26/2012 Elsevier Interactive Patient Education 2016 Elsevier Inc[employer]  Breastfeeding Deciding to breastfeed is one of the best choices you can make for you and your baby. A change in hormones during pregnancy causes your breast tissue to grow and increases the number and size of your milk ducts. These hormones also allow proteins, sugars, and fats from your blood supply to make breast milk in your milk-producing glands. Hormones prevent breast milk from being released before your baby is born as well as prompt milk flow after birth. Once breastfeeding has begun, thoughts of your baby, as well as his or her sucking or crying, can stimulate the release of milk from your milk-producing glands.  BENEFITS OF BREASTFEEDING For Your Baby  Your first milk (colostrum) helps your baby's digestive system function better.  There are antibodies in your milk that help your baby fight off infections.  Your baby has a lower incidence of asthma, allergies, and sudden infant death syndrome.  The nutrients in breast milk are better for your baby than infant formulas and are designed uniquely for your baby's needs.  Breast milk improves your baby's brain development.  Your baby is less likely to develop other conditions, such as childhood obesity, asthma, or type 2 diabetes mellitus. For You  Breastfeeding helps to create a very special bond between you and your baby.  Breastfeeding is convenient. Breast milk is always available at the correct temperature and costs nothing.  Breastfeeding helps to burn calories and helps you lose the weight gained during pregnancy.  Breastfeeding makes your uterus contract to its prepregnancy size faster and slows bleeding (lochia) after you give birth.   Breastfeeding helps to lower your risk of developing type 2 diabetes mellitus, osteoporosis, and breast or ovarian cancer later in life. SIGNS THAT YOUR BABY IS  HUNGRY Early Signs of Hunger  Increased alertness or activity.  Stretching.  Movement of the head from side to side.  Movement of the head and opening of the mouth when the corner of the mouth or cheek is stroked (rooting).  Increased sucking sounds, smacking lips, cooing, sighing, or squeaking.  Hand-to-mouth movements.  Increased sucking of fingers or hands. Late Signs of Hunger  Fussing.  Intermittent crying. Extreme Signs of Hunger Signs of extreme hunger will require calming and consoling before your baby will be able to breastfeed successfully. Do not wait for the following signs of extreme hunger to occur before you initiate breastfeeding:  Restlessness.  A loud, strong cry.  Screaming.   Screaming. BREASTFEEDING BASICS Breastfeeding Initiation  Find a comfortable place to sit or lie down, with your neck and back well supported.  Place a pillow or rolled up blanket under your baby to bring him or her to the level of your breast (if you are seated). Nursing pillows are specially designed to help support your arms and your baby while you breastfeed.  Make sure that your baby's abdomen is facing your abdomen.  Gently massage your breast. With your fingertips, massage from your chest wall toward your nipple in a circular motion. This encourages milk flow. You may need to continue this action during the feeding if your milk flows slowly.  Support your breast with 4 fingers underneath and your thumb above your nipple. Make sure your fingers are well away from your nipple and your baby's mouth.  Stroke your baby's lips gently with your finger or nipple.  When your baby's mouth is open wide enough, quickly bring your baby to your breast, placing your entire nipple and as much of the colored area around your nipple (areola) as possible into your baby's mouth.  More areola should be visible above your baby's upper lip than below the lower lip.  Your baby's tongue should be between  his or her lower gum and your breast.  Ensure that your baby's mouth is correctly positioned around your nipple (latched). Your baby's lips should create a seal on your breast and be turned out (everted).  It is common for your baby to suck about 2-3 minutes in order to start the flow of breast milk. Latching Teaching your baby how to latch on to your breast properly is very important. An improper latch can cause nipple pain and decreased milk supply for you and poor weight gain in your baby. Also, if your baby is not latched onto your nipple properly, he or she may swallow some air during feeding. This can make your baby fussy. Burping your baby when you switch breasts during the feeding can help to get rid of the air. However, teaching your baby to latch on properly is still the best way to prevent fussiness from swallowing air while breastfeeding. Signs that your baby has successfully latched on to your nipple:  Silent tugging or silent sucking, without causing you pain.  Swallowing heard between every 3-4 sucks.  Muscle movement above and in front of his or her ears while sucking. Signs that your baby has not successfully latched on to nipple:  Sucking sounds or smacking sounds from your baby while breastfeeding.  Nipple pain. If you think your baby has not latched on correctly, slip your finger into the corner of your baby's mouth to break the suction and place it between your baby's gums. Attempt breastfeeding initiation again. Signs of Successful Breastfeeding Signs from your baby:  A gradual decrease in the number of sucks or complete cessation of sucking.  Falling asleep.  Relaxation of his or her body.  Retention of a small amount of milk in his or her mouth.  Letting go of your breast by himself or herself. Signs from you:  Breasts that have increased in firmness, weight, and size 1-3 hours after feeding.  Breasts that are softer immediately after  breastfeeding.  Increased milk volume, as well as a change in milk consistency and color by the fifth day of breastfeeding.  Nipples that are not sore, cracked, or bleeding. Signs That Your Pecola LeisureBaby is Getting Enough Milk  Wetting at least 3 diapers in a 24-hour  period. The urine should be clear and pale yellow by age 12 days.  At least 3 stools in a 24-hour period by age 12 days. The stool should be soft and yellow.  At least 3 stools in a 24-hour period by age 16 days. The stool should be seedy and yellow.  No loss of weight greater than 10% of birth weight during the first 423 days of age.  Average weight gain of 4-7 ounces (113-198 g) per week after age 336 days.  Consistent daily weight gain by age 12 days, without weight loss after the age of 2 weeks. After a feeding, your baby may spit up a small amount. This is common. BREASTFEEDING FREQUENCY AND DURATION Frequent feeding will help you make more milk and can prevent sore nipples and breast engorgement. Breastfeed when you feel the need to reduce the fullness of your breasts or when your baby shows signs of hunger. This is called "breastfeeding on demand." Avoid introducing a pacifier to your baby while you are working to establish breastfeeding (the first 4-6 weeks after your baby is born). After this time you may choose to use a pacifier. Research has shown that pacifier use during the first year of a baby's life decreases the risk of sudden infant death syndrome (SIDS). Allow your baby to feed on each breast as long as he or she wants. Breastfeed until your baby is finished feeding. When your baby unlatches or falls asleep while feeding from the first breast, offer the second breast. Because newborns are often sleepy in the first few weeks of life, you may need to awaken your baby to get him or her to feed. Breastfeeding times will vary from baby to baby. However, the following rules can serve as a guide to help you ensure that your baby is  properly fed:  Newborns (babies 154 weeks of age or younger) may breastfeed every 1-3 hours.  Newborns should not go longer than 3 hours during the day or 5 hours during the night without breastfeeding.  You should breastfeed your baby a minimum of 8 times in a 24-hour period until you begin to introduce solid foods to your baby at around 466 months of age. BREAST MILK PUMPING Pumping and storing breast milk allows you to ensure that your baby is exclusively fed your breast milk, even at times when you are unable to breastfeed. This is especially important if you are going back to work while you are still breastfeeding or when you are not able to be present during feedings. Your lactation consultant can give you guidelines on how long it is safe to store breast milk. A breast pump is a machine that allows you to pump milk from your breast into a sterile bottle. The pumped breast milk can then be stored in a refrigerator or freezer. Some breast pumps are operated by hand, while others use electricity. Ask your lactation consultant which type will work best for you. Breast pumps can be purchased, but some hospitals and breastfeeding support groups lease breast pumps on a monthly basis. A lactation consultant can teach you how to hand express breast milk, if you prefer not to use a pump. CARING FOR YOUR BREASTS WHILE YOU BREASTFEED Nipples can become dry, cracked, and sore while breastfeeding. The following recommendations can help keep your breasts moisturized and healthy:  Avoid using soap on your nipples.  Wear a supportive bra. Although not required, special nursing bras and tank tops are designed to allow access to  breasts for breastfeeding without taking off your entire bra or top. Avoid wearing underwire-style bras or extremely tight bras.  Air dry your nipples for 3-4minutes after each feeding.  Use only cotton bra pads to absorb leaked breast milk. Leaking of breast milk between feedings  is normal.  Use lanolin on your nipples after breastfeeding. Lanolin helps to maintain your skin's normal moisture barrier. If you use pure lanolin, you do not need to wash it off before feeding your baby again. Pure lanolin is not toxic to your baby. You may also hand express a few drops of breast milk and gently massage that milk into your nipples and allow the milk to air dry. In the first few weeks after giving birth, some women experience extremely full breasts (engorgement). Engorgement can make your breasts feel heavy, warm, and tender to the touch. Engorgement peaks within 3-5 days after you give birth. The following recommendations can help ease engorgement:  Completely empty your breasts while breastfeeding or pumping. You may want to start by applying warm, moist heat (in the shower or with warm water-soaked hand towels) just before feeding or pumping. This increases circulation and helps the milk flow. If your baby does not completely empty your breasts while breastfeeding, pump any extra milk after he or she is finished.  Wear a snug bra (nursing or regular) or tank top for 1-2 days to signal your body to slightly decrease milk production.  Apply ice packs to your breasts, unless this is too uncomfortable for you.  Make sure that your baby is latched on and positioned properly while breastfeeding. If engorgement persists after 48 hours of following these recommendations, contact your health care provider or a lactation consultant. OVERALL HEALTH CARE RECOMMENDATIONS WHILE BREASTFEEDING  Eat healthy foods. Alternate between meals and snacks, eating 3 of each per day. Because what you eat affects your breast milk, some of the foods may make your baby more irritable than usual. Avoid eating these foods if you are sure that they are negatively affecting your baby.  Drink milk, fruit juice, and water to satisfy your thirst (about 10 glasses a day).  Rest often, relax, and continue to take  your prenatal vitamins to prevent fatigue, stress, and anemia.  Continue breast self-awareness checks.  Avoid chewing and smoking tobacco. Chemicals from cigarettes that pass into breast milk and exposure to secondhand smoke may harm your baby.  Avoid alcohol and drug use, including marijuana. Some medicines that may be harmful to your baby can pass through breast milk. It is important to ask your health care provider before taking any medicine, including all over-the-counter and prescription medicine as well as vitamin and herbal supplements. It is possible to become pregnant while breastfeeding. If birth control is desired, ask your health care provider about options that will be safe for your baby. SEEK MEDICAL CARE IF:  You feel like you want to stop breastfeeding or have become frustrated with breastfeeding.  You have painful breasts or nipples.  Your nipples are cracked or bleeding.  Your breasts are red, tender, or warm.  You have a swollen area on either breast.  You have a fever or chills.  You have nausea or vomiting.  You have drainage other than breast milk from your nipples.  Your breasts do not become full before feedings by the fifth day after you give birth.  You feel sad and depressed.  Your baby is too sleepy to eat well.  Your baby is having trouble sleeping.     sleeping.   Your baby is wetting less than 3 diapers in a 24-hour period.  Your baby has less than 3 stools in a 24-hour period.  Your baby's skin or the white part of his or her eyes becomes yellow.   Your baby is not gaining weight by 51 days of age. SEEK IMMEDIATE MEDICAL CARE IF:  Your baby is overly tired (lethargic) and does not want to wake up and feed.  Your baby develops an unexplained fever.   This information is not intended to replace advice given to you by your health care provider. Make sure you discuss any questions you have with your health care provider.   Document Released: 02/23/2005  Document Revised: 11/14/2014 Document Reviewed: 08/17/2012 Elsevier Interactive Patient Education 2016 [employer] PEDIATRIC/FAMILY PRACTICE PHYSICIANS  [facility] [address] [place], [place]  [address] Phone - [phone]   Fax - [phone]  [facility] [address] [place], [place] [address] Phone - [phone]   Fax - [phone]  [facility] [address] [place], [place]  [address] Phone - [phone]   Fax - [phone]  [facility] [address] [place], [place]  [address] Phone - [phone]   Fax - [phone]  [facility] [address] [place], [place]  [address] Phone - [phone]   Fax - [phone]  [facility] [address] [place], [place]  [address] Phone - [phone]   Fax - [phone]  [facility]  [address] [place], [place]  [address] Phone - [phone]   Fax - [phone]  [facility] [address] [place], [address] [place], [place]  [address] Phone - [phone]   Fax - [phone]  [facility] AT [facility] [address] [place], [place]  [address] Phone - [phone]   Fax - [phone] [facility] [place] [address] [place], [place]  [address] Phone - [phone]   Fax - [phone]  [facility] [address] [place], [place]  [address] Phone - [phone]   Fax - [phone]  [facility] [address] [place], [place]  [address] Phone - [phone]   Fax - [phone]  [facility] [address] [place], [place]  [address] Phone - [phone]   Fax - [phone]  [facility] [address] [place], [place]  [address] Phone - [phone]  [facility] [address]  [place], [place]  [address] Phone - [phone]   Fax - [phone]  [facility] [address] [place], [place]  [address] Phone - [phone]   Fax - [phone]  [facility] [address] [place], [place]  [address] Phone - [phone]   Fax - [phone]  [facility] [address] [place], [place]  [address] Phone - [phone]   Fax - [phone]   [facility] [address] [place], [place]  [address] Phone - [phone]   Fax - [phone]  [facility] [address] [place], [place]  [address] Phone - [phone]   Fax - [phone]  DAVID RUBIN [address] [place], [place]  [address] Phone - [phone]   Fax - [phone]  [facility] [phone]  [place], [place]  [address] Phone - [phone]   Fax - [phone]  [facility] [place] [address] [place], [place]  [address] Phone - [phone]   Fax - [phone] [facility] [place] [id number] [place], [place]  [address] Phone - [phone]   Fax - [phone]  [facility] [place] [address] [place], [place]  [address] Phone - [phone]   Fax - [phone]  [facility] [place] [address] [place], [place]  [address] Phone - [phone]   Fax - [phone]

## 2016-01-27 ENCOUNTER — Ambulatory Visit (HOSPITAL_COMMUNITY)
Admission: RE | Admit: 2016-01-27 | Discharge: 2016-01-27 | Disposition: A | Discharge status: Home / Self Care | Payer: Medicaid Other | Source: Ambulatory Visit | Attending: Family Medicine | Admitting: Family Medicine

## 2016-01-27 ENCOUNTER — Encounter (HOSPITAL_COMMUNITY): Payer: Self-pay

## 2016-01-27 ENCOUNTER — Other Ambulatory Visit (HOSPITAL_COMMUNITY): Payer: Self-pay | Admitting: Obstetrics and Gynecology

## 2016-01-27 DIAGNOSIS — O30043 Twin pregnancy, dichorionic/diamniotic, third trimester: Secondary | ICD-10-CM | POA: Insufficient documentation

## 2016-01-27 DIAGNOSIS — Z3A34 34 weeks gestation of pregnancy: Secondary | ICD-10-CM

## 2016-01-27 DIAGNOSIS — Z362 Encounter for other antenatal screening follow-up: Secondary | ICD-10-CM

## 2016-01-27 DIAGNOSIS — O30049 Twin pregnancy, dichorionic/diamniotic, unspecified trimester: Secondary | ICD-10-CM

## 2016-02-03 ENCOUNTER — Ambulatory Visit (INDEPENDENT_AMBULATORY_CARE_PROVIDER_SITE_OTHER): Payer: Medicaid Other | Admitting: Obstetrics and Gynecology

## 2016-02-03 ENCOUNTER — Other Ambulatory Visit (HOSPITAL_COMMUNITY)
Admission: RE | Admit: 2016-02-03 | Discharge: 2016-02-03 | Disposition: A | Discharge status: Home / Self Care | Payer: Medicaid Other | Source: Ambulatory Visit | Attending: Obstetrics and Gynecology | Admitting: Obstetrics and Gynecology

## 2016-02-03 VITALS — BP 98/68 | HR 79 | Wt 139.3 lb

## 2016-02-03 DIAGNOSIS — Z113 Encounter for screening for infections with a predominantly sexual mode of transmission: Secondary | ICD-10-CM | POA: Diagnosis not present

## 2016-02-03 DIAGNOSIS — Z789 Other specified health status: Secondary | ICD-10-CM

## 2016-02-03 DIAGNOSIS — O30043 Twin pregnancy, dichorionic/diamniotic, third trimester: Secondary | ICD-10-CM

## 2016-02-03 DIAGNOSIS — O0990 Supervision of high risk pregnancy, unspecified, unspecified trimester: Secondary | ICD-10-CM

## 2016-02-03 DIAGNOSIS — O0993 Supervision of high risk pregnancy, unspecified, third trimester: Secondary | ICD-10-CM

## 2016-02-03 NOTE — Progress Notes (Signed)
Prenatal Visit Note Date: 02/03/2016 [facility]  Subjective:  Jenna Walton is a 25 y.o. G1P0000 at 7764w6d being seen today for ongoing prenatal care.  She is currently monitored for the following issues for this high-risk pregnancy and has Dichorionic diamniotic twin pregnancy; Supervision of high risk pregnancy, antepartum; and Language barrier on her problem list.  Patient reports no complaints.   Contractions: Irritability. Vag. Bleeding: None.  Movement: Present. Denies leaking of fluid.   The following portions of the patient's history were reviewed and updated as appropriate: allergies, current medications, past family history, past medical history, past social history, past surgical history and problem list. Problem list updated.  Objective:   Vitals:   02/03/16 0912  BP: 98/68  Pulse: 79  Weight: 139 lb 4.8 oz (63.2 kg)    Fetal Status: Fetal Heart Rate (bpm): 131/128   Movement: Present     General:  Alert, oriented and cooperative. Patient is in no acute distress.  Skin: Skin is warm and dry. No rash noted.   Cardiovascular: Normal heart rate noted  Respiratory: Normal respiratory effort, no problems with respiration noted  Abdomen: Soft, gravid, appropriate for gestational age. Pain/Pressure: Present     Pelvic:  Cervical exam deferred        Extremities: Normal range of motion.  Edema: None  Mental Status: Normal mood and affect. Normal behavior. Normal judgment and thought content.   Urinalysis:      Assessment and Plan:  Pregnancy: G1P0000 at 5764w6d  1. Supervision of high risk pregnancy in third trimester Talk with pt re: BC nv - Culture, beta strep (group b only) - GC/Chlamydia probe amp ([place]  2. Di-di twins Normal growth ac/afi/mvp on 11/20 and ceph/ceph D/w her and husband re: delivery mode and she's undecided. Discordance <20% Pt not scheduled for ap testing so will bring later this week for 2x/week testing to  start Delivery at 38wks  Interpreter used  Preterm labor symptoms and general obstetric precautions including but not limited to vaginal bleeding, contractions, leaking of fluid and fetal movement were reviewed in detail with the patient. Please refer to After Visit Summary for other counseling recommendations.  Return for start 2x/week testing later this week.   [place] Bingharlie Nasire Reali, MD

## 2016-02-03 NOTE — Progress Notes (Signed)
[place] interpreter "Mai" [phone] used for visit Cultures today

## 2016-02-05 LAB — GC/CHLAMYDIA PROBE AMP (~~LOC~~) NOT AT ARMC
CHLAMYDIA, DNA PROBE: NEGATIVE
NEISSERIA GONORRHEA: NEGATIVE

## 2016-02-06 ENCOUNTER — Ambulatory Visit: Payer: Self-pay

## 2016-02-06 ENCOUNTER — Ambulatory Visit (INDEPENDENT_AMBULATORY_CARE_PROVIDER_SITE_OTHER): Payer: Medicaid Other | Admitting: *Deleted

## 2016-02-06 DIAGNOSIS — O30043 Twin pregnancy, dichorionic/diamniotic, third trimester: Secondary | ICD-10-CM

## 2016-02-06 DIAGNOSIS — Z3689 Encounter for other specified antenatal screening: Secondary | ICD-10-CM

## 2016-02-06 LAB — CULTURE, BETA STREP (GROUP B ONLY)

## 2016-02-06 NOTE — Progress Notes (Signed)
Pt informed that the ultrasound is considered a limited OB ultrasound and is not intended to be a complete ultrasound exam.  Patient also informed that the ultrasound is not being completed with the intent of assessing for fetal or placental anomalies or any pelvic abnormalities.  Explained that the purpose of today's ultrasound is to assess for presentation and amniotic fluid.  Patient acknowledges the purpose of the exam and the limitations of the study.    Video interpreter # [phone] used for encounter today

## 2016-02-10 ENCOUNTER — Ambulatory Visit (INDEPENDENT_AMBULATORY_CARE_PROVIDER_SITE_OTHER): Payer: Medicaid Other | Admitting: Medical

## 2016-02-10 VITALS — BP 120/70 | HR 73 | Wt 141.5 lb

## 2016-02-10 DIAGNOSIS — O0990 Supervision of high risk pregnancy, unspecified, unspecified trimester: Secondary | ICD-10-CM

## 2016-02-10 DIAGNOSIS — O0993 Supervision of high risk pregnancy, unspecified, third trimester: Secondary | ICD-10-CM

## 2016-02-10 DIAGNOSIS — O30043 Twin pregnancy, dichorionic/diamniotic, third trimester: Secondary | ICD-10-CM

## 2016-02-10 NOTE — Progress Notes (Signed)
   PRENATAL VISIT NOTE  Subjective:  Jenna Walton is a 25 y.o. G1P0000 at 5876w6d being seen today for ongoing prenatal care.  She is currently monitored for the following issues for this high-risk pregnancy and has Dichorionic diamniotic twin pregnancy; Supervision of high risk pregnancy, antepartum; and Language barrier on her problem list.  Patient reports no complaints.  Contractions: Irregular. Vag. Bleeding: None.  Movement: Present. Denies leaking of fluid.   The following portions of the patient's history were reviewed and updated as appropriate: allergies, current medications, past family history, past medical history, past social history, past surgical history and problem list. Problem list updated.  Objective:   Vitals:   02/10/16 0937  BP: 120/70  Pulse: 73  Weight: 141 lb 8 oz (64.2 kg)    Fetal Status: Fetal Heart Rate (bpm): NST Fundal Height: 41 cm Movement: Present     General:  Alert, oriented and cooperative. Patient is in no acute distress.  Skin: Skin is warm and dry. No rash noted.   Cardiovascular: Normal heart rate noted  Respiratory: Normal respiratory effort, no problems with respiration noted  Abdomen: Soft, gravid, appropriate for gestational age. Pain/Pressure: Present     Pelvic:  Cervical exam deferred        Extremities: Normal range of motion.  Edema: None  Mental Status: Normal mood and affect. Normal behavior. Normal judgment and thought content.   Assessment and Plan:  Pregnancy: G1P0000 at 1876w6d  1. Supervision of high risk pregnancy in third trimester  2. Dichorionic diamniotic twin pregnancy in third trimester - Fetal nonstress test - reviewed, reactive - Patient has had discussion at past visits regarding mode of delivery, has decided to schedule C/S. Message sent to surgical scheduler today.  - Patient will have NST as scheduled on Thursday and continue 2x/week testing until delivery   Term labor symptoms and general obstetric precautions  including but not limited to vaginal bleeding, contractions, leaking of fluid and fetal movement were reviewed in detail with the patient. Please refer to After Visit Summary for other counseling recommendations.  Return in about 7 days (around 02/17/2016) for Ob fu and NST - twins.   Marny LowensteinJulie N Tunisha Ruland, PA-C

## 2016-02-10 NOTE — Patient Instructions (Signed)
Cesarean Delivery °Cesarean birth, or cesarean delivery, is the surgical delivery of a baby through an incision in the abdomen and the uterus. This may be referred to as a C-section. This procedure may be scheduled ahead of time, or it may be done in an emergency situation. °Tell a health care provider about: °· Any allergies you have. °· All medicines you are taking, including vitamins, herbs, eye drops, creams, and over-the-counter medicines. °· Any problems you or family members have had with anesthetic medicines. °· Any blood disorders you have. °· Any surgeries you have had. °· Any medical conditions you have. °· Whether you or any members of your family have a history of deep vein thrombosis (DVT) or pulmonary embolism (PE). °What are the risks? °Generally, this is a safe procedure. However, problems may occur, including: °· Infection. °· Bleeding. °· Allergic reactions to medicines. °· Damage to other structures or organs. °· Blood clots. °· Injury to your baby. ° °What happens before the procedure? °· Follow instructions from your health care provider about eating or drinking restrictions. °· Follow instructions from your health care provider about bathing before your procedure to help reduce your risk of infection. °· If you know that you are going to have a cesarean delivery, do not shave your pubic area. Shaving before the procedure may increase your risk of infection. °· Ask your health care provider about: °? Changing or stopping your regular medicines. This is especially important if you are taking diabetes medicines or blood thinners. °? Your pain management plan. This is especially important if you plan to breastfeed your baby. °? How long you will be in the hospital after the procedure. °? Any concerns you may have about receiving blood products if you need them during the procedure. °? Cord blood banking, if you plan to collect your baby’s umbilical cord blood. °· You may also want to ask your  health care provider: °? Whether you will be able to hold or breastfeed your baby while you are still in the operating room. °? Whether your baby can stay with you immediately after the procedure and during your recovery. °? Whether a family member or a person of your choice can go with you into the operating room and stay with you during the procedure, immediately after the procedure, and during your recovery. °· Plan to have someone drive you home when you are discharged from the hospital. °What happens during the procedure? °· Fetal monitors will be placed on your abdomen to monitor your heart rate and your baby's heart rate. °· Depending on the reason for your cesarean delivery, you may have a physical exam or additional testing, such as an ultrasound. °· An IV tube will be inserted into one of your veins. °· You may have your blood or urine tested. °· You will be given antibiotic medicine to help prevent infection. °· You may be given a special warming gown to wear to keep your temperature stable. °· Hair may be removed from your pubic area. °· The skin of your pubic area and lower abdomen will be cleaned with a germ-killing solution (antiseptic). °· A catheter may be inserted into your bladder through your urethra. This drains your urine during the procedure. °· You may be given one or more of the following: °? A medicine to numb the area (local anesthetic). °? A medicine to make you fall asleep (general anesthetic). °? A medicine (regional anesthetic) that is injected into your back or through a small   tube placed in your back (spinal anesthetic or epidural anesthetic). This numbs everything below the injection site and allows you to stay awake during your procedure. If this makes you feel nauseous, tell your health care provider. Medicines will be available to help reduce any nausea you may feel.  An incision will be made in your abdomen, and then in your uterus.  If you are awake during your procedure, you  may feel tugging and pulling in your abdomen, but you should not feel pain. If you feel pain, tell your health care provider immediately.  Your baby will be removed from your uterus. You may feel more pressure or pushing while this happens.  Immediately after birth, your baby will be dried and kept warm. You may be able to hold and breastfeed your baby. The umbilical cord may be clamped and cut during this time.  Your placenta will be removed from your uterus.  Your incisions will be closed with stitches (sutures). Staples, skin glue, or adhesive strips may also be applied to the incision in your abdomen.  Bandages (dressings) will be placed over the incision in your abdomen. The procedure may vary among health care providers and hospitals. What happens after the procedure?  Your blood pressure, heart rate, breathing rate, and blood oxygen level will be monitored often until the medicines you were given have worn off.  You may continue to receive fluids and medicines through an IV tube.  You will have some pain. Medicines will be available to help control your pain.  To help prevent blood clots:  You may be given medicines.  You may have to wear compression stockings or devices.  You will be encouraged to walk around when you are able.  Hospital staff will encourage and support bonding with your baby. Your hospital may allow you and your baby to stay in the same room (rooming in) during your hospital stay to encourage successful breastfeeding.  You may be encouraged to cough and breathe deeply often. This helps to prevent lung problems.  If you have a catheter draining your urine, it will be removed as soon as possible after your procedure. This information is not intended to replace advice given to you by your health care provider. Make sure you discuss any questions you have with your health care provider. Document Released: 02/23/2005 Document Revised: 08/01/2015 Document  Reviewed: 12/04/2014 Elsevier Interactive Patient Education  2017 Elsevier Inc. Introduction Patient Name: ________________________________________________ Patient Due Date: ____________________ What is a fetal movement count? A fetal movement count is the number of times that you feel your baby move during a certain amount of time. This may also be called a fetal kick count. A fetal movement count is recommended for every pregnant woman. You may be asked to start counting fetal movements as early as week 28 of your pregnancy. Pay attention to when your baby is most active. You may notice your baby's sleep and wake cycles. You may also notice things that make your baby move more. You should do a fetal movement count:  When your baby is normally most active.  At the same time each day. A good time to count movements is while you are resting, after having something to eat and drink. How do I count fetal movements? 1. Find a quiet, comfortable area. Sit, or lie down on your side. 2. Write down the date, the start time and stop time, and the number of movements that you felt between those two times. Take this  information with you to your health care visits. 3. For 2 hours, count kicks, flutters, swishes, rolls, and jabs. You should feel at least 10 movements during 2 hours. 4. You may stop counting after you have felt 10 movements. 5. If you do not feel 10 movements in 2 hours, have something to eat and drink. Then, keep resting and counting for 1 hour. If you feel at least 4 movements during that hour, you may stop counting. Contact a health care provider if:  You feel fewer than 4 movements in 2 hours.  Your baby is not moving like he or she usually does. Date: ____________ Start time: ____________ Stop time: ____________ Movements: ____________ Date: ____________ Start time: ____________ Stop time: ____________ Movements: ____________ Date: ____________ Start time: ____________ Stop time:  ____________ Movements: ____________ Date: ____________ Start time: ____________ Stop time: ____________ Movements: ____________ Date: ____________ Start time: ____________ Stop time: ____________ Movements: ____________ Date: ____________ Start time: ____________ Stop time: ____________ Movements: ____________ Date: ____________ Start time: ____________ Stop time: ____________ Movements: ____________ Date: ____________ Start time: ____________ Stop time: ____________ Movements: ____________ Date: ____________ Start time: ____________ Stop time: ____________ Movements: ____________ This information is not intended to replace advice given to you by your health care provider. Make sure you discuss any questions you have with your health care provider. Document Released: 03/25/2006 Document Revised: 10/23/2015 Document Reviewed: 04/04/2015 Elsevier Interactive Patient Education  2017 Elsevier Inc. [employer] CorporationBraxton Hicks Contractions Contractions of the uterus can occur throughout pregnancy. Contractions are not always a sign that you are in labor.  WHAT ARE BRAXTON HICKS CONTRACTIONS?  Contractions that occur before labor are called Braxton Hicks contractions, or false labor. Toward the end of pregnancy (32-34 weeks), these contractions can develop more often and may become more forceful. This is not true labor because these contractions do not result in opening (dilatation) and thinning of the cervix. They are sometimes difficult to tell apart from true labor because these contractions can be forceful and people have different pain tolerances. You should not feel embarrassed if you go to the hospital with false labor. Sometimes, the only way to tell if you are in true labor is for your health care provider to look for changes in the cervix. If there are no prenatal problems or other health problems associated with the pregnancy, it is completely safe to be sent home with false labor and await the onset of true  labor. HOW CAN YOU TELL THE DIFFERENCE BETWEEN TRUE AND FALSE LABOR? False Labor   The contractions of false labor are usually shorter and not as hard as those of true labor.   The contractions are usually irregular.   The contractions are often felt in the front of the lower abdomen and in the groin.   The contractions may go away when you walk around or change positions while lying down.   The contractions get weaker and are shorter lasting as time goes on.   The contractions do not usually become progressively stronger, regular, and closer together as with true labor.  True Labor   Contractions in true labor last 30-70 seconds, become very regular, usually become more intense, and increase in frequency.   The contractions do not go away with walking.   The discomfort is usually felt in the top of the uterus and spreads to the lower abdomen and low back.   True labor can be determined by your health care provider with an exam. This will show that the cervix is  dilating and getting thinner.  WHAT TO REMEMBER  Keep up with your usual exercises and follow other instructions given by your health care provider.   Take medicines as directed by your health care provider.   Keep your regular prenatal appointments.   Eat and drink lightly if you think you are going into labor.   If Braxton Hicks contractions are making you uncomfortable:   Change your position from lying down or resting to walking, or from walking to resting.   Sit and rest in a tub of warm water.   Drink 2-3 glasses of water. Dehydration may cause these contractions.   Do slow and deep breathing several times an hour.  WHEN SHOULD I SEEK IMMEDIATE MEDICAL CARE? Seek immediate medical care if:  Your contractions become stronger, more regular, and closer together.   You have fluid leaking or gushing from your vagina.   You have a fever.   You pass blood-tinged mucus.   You have  vaginal bleeding.   You have continuous abdominal pain.   You have low back pain that you never had before.   You feel your baby's head pushing down and causing pelvic pressure.   Your baby is not moving as much as it used to.  This information is not intended to replace advice given to you by your health care provider. Make sure you discuss any questions you have with your health care provider. Document Released: 02/23/2005 Document Revised: 06/17/2015 Document Reviewed: 12/05/2012 Elsevier Interactive Patient Education  2017 [employer]

## 2016-02-11 NOTE — Progress Notes (Signed)
NST reviewed and reactive x 2 

## 2016-02-12 ENCOUNTER — Telehealth: Payer: Self-pay | Admitting: Family Medicine

## 2016-02-12 ENCOUNTER — Encounter (HOSPITAL_COMMUNITY): Payer: Self-pay | Admitting: *Deleted

## 2016-02-12 NOTE — Telephone Encounter (Signed)
Called with Interpreter to let patient know her appointment was being changed due to office meeting. No way to leave a message, her voicemail was not set up. Called other number listed, and he stated wrong number.

## 2016-02-13 ENCOUNTER — Encounter (HOSPITAL_COMMUNITY): Payer: Self-pay

## 2016-02-13 ENCOUNTER — Ambulatory Visit (INDEPENDENT_AMBULATORY_CARE_PROVIDER_SITE_OTHER): Payer: Medicaid Other | Admitting: Obstetrics & Gynecology

## 2016-02-13 ENCOUNTER — Telehealth: Payer: Self-pay | Admitting: Obstetrics and Gynecology

## 2016-02-13 ENCOUNTER — Other Ambulatory Visit: Payer: Medicaid Other

## 2016-02-13 VITALS — BP 121/79 | HR 86 | Wt 138.0 lb

## 2016-02-13 DIAGNOSIS — O0990 Supervision of high risk pregnancy, unspecified, unspecified trimester: Secondary | ICD-10-CM

## 2016-02-13 DIAGNOSIS — O30043 Twin pregnancy, dichorionic/diamniotic, third trimester: Secondary | ICD-10-CM

## 2016-02-13 NOTE — Progress Notes (Signed)
NSTx2 reviewed and reactive.  Layney Gillson L. Harraway-Smith, M.D., FACOG    

## 2016-02-13 NOTE — Pre-Procedure Instructions (Signed)
Interpreter number [phone]

## 2016-02-13 NOTE — Telephone Encounter (Signed)
Called and spoke to husband this morning about patient coming in at 1:40 . Husband stated he would have his wife here.

## 2016-02-18 ENCOUNTER — Encounter (HOSPITAL_COMMUNITY)
Admission: RE | Admit: 2016-02-18 | Discharge: 2016-02-18 | Disposition: A | Discharge status: Home / Self Care | Payer: Medicaid Other | Source: Ambulatory Visit | Attending: Obstetrics and Gynecology | Admitting: Obstetrics and Gynecology

## 2016-02-18 LAB — CBC
HCT: 29.5 % — ABNORMAL LOW (ref 36.0–46.0)
HEMOGLOBIN: 9.1 g/dL — AB (ref 12.0–15.0)
MCH: 25.5 pg — ABNORMAL LOW (ref 26.0–34.0)
MCHC: 30.8 g/dL (ref 30.0–36.0)
MCV: 82.6 fL (ref 78.0–100.0)
Platelets: 396 10*3/uL (ref 150–400)
RBC: 3.57 MIL/uL — AB (ref 3.87–5.11)
RDW: 15.5 % (ref 11.5–15.5)
WBC: 7.7 10*3/uL (ref 4.0–10.5)

## 2016-02-18 NOTE — Patient Instructions (Signed)
20 Jenna Walton  02/18/2016   Your procedure is scheduled on:  02/19/2016  [facility] through [facility] of [facility] at 0900 AM.  Pick up the phone at the desk and dial [phone].   Call this number if you have problems the morning of surgery: [phone]   Remember:   Do not eat food:After Midnight.  Do not drink clear liquids: After Midnight.  Take these medicines the morning of surgery with A SIP OF WATER: none   Do not wear jewelry, make-up or nail polish.  Do not wear lotions, powders, or perfumes. Do not wear deodorant.  Do not shave 48 hours prior to surgery.  Do not bring valuables to the hospital.  [facility] is not   responsible for any belongings or valuables brought to the hospital.  Contacts, dentures or bridgework may not be worn into surgery.  Leave suitcase in the car. After surgery it may be brought to your room.  For patients admitted to the hospital, checkout time is 11:00 AM the day of              discharge.   Patients discharged the day of surgery will not be allowed to drive             home.  Name and phone number of your driver: na  Special Instructions:   N/A   Please read over the following fact sheets that you were given:   Surgical Site Infection Prevention

## 2016-02-19 ENCOUNTER — Other Ambulatory Visit: Payer: Medicaid Other | Admitting: Medical

## 2016-02-19 ENCOUNTER — Encounter (HOSPITAL_COMMUNITY): Payer: Self-pay | Admitting: Anesthesiology

## 2016-02-19 ENCOUNTER — Inpatient Hospital Stay (HOSPITAL_COMMUNITY): Payer: Medicaid Other | Admitting: Anesthesiology

## 2016-02-19 ENCOUNTER — Encounter (HOSPITAL_COMMUNITY)
Admission: RE | Disposition: A | Discharge status: Home / Self Care | Payer: Self-pay | Source: Ambulatory Visit | Attending: Family Medicine

## 2016-02-19 ENCOUNTER — Inpatient Hospital Stay (HOSPITAL_COMMUNITY)
Admission: RE | Admit: 2016-02-19 | Discharge: 2016-02-21 | DRG: 765 | Disposition: A | Discharge status: Home / Self Care | Payer: Medicaid Other | Source: Ambulatory Visit | Attending: Family Medicine | Admitting: Family Medicine

## 2016-02-19 DIAGNOSIS — Z3A38 38 weeks gestation of pregnancy: Secondary | ICD-10-CM

## 2016-02-19 DIAGNOSIS — O30043 Twin pregnancy, dichorionic/diamniotic, third trimester: Secondary | ICD-10-CM | POA: Diagnosis present

## 2016-02-19 DIAGNOSIS — D649 Anemia, unspecified: Secondary | ICD-10-CM | POA: Diagnosis not present

## 2016-02-19 DIAGNOSIS — O9081 Anemia of the puerperium: Secondary | ICD-10-CM | POA: Diagnosis present

## 2016-02-19 DIAGNOSIS — Z98891 History of uterine scar from previous surgery: Secondary | ICD-10-CM

## 2016-02-19 LAB — ABO/RH: ABO/RH(D): O POS

## 2016-02-19 LAB — RPR: RPR: NONREACTIVE

## 2016-02-19 SURGERY — Surgical Case
Anesthesia: Spinal | Site: Abdomen | Wound class: Clean Contaminated

## 2016-02-19 MED ORDER — MORPHINE SULFATE (PF) 0.5 MG/ML IJ SOLN
INTRAMUSCULAR | Status: DC | PRN
  Administered 2016-02-19: .2 mg via INTRATHECAL

## 2016-02-19 MED ORDER — SCOPOLAMINE 1 MG/3DAYS TD PT72
1.0000 | MEDICATED_PATCH | Freq: Once | TRANSDERMAL | Status: DC
Start: 2016-02-19 — End: 2016-02-19
  Filled 2016-02-19: qty 1

## 2016-02-19 MED ORDER — COCONUT OIL OIL: 1.0000 "application " | TOPICAL_OIL | Status: DC | PRN

## 2016-02-19 MED ORDER — OXYTOCIN 10 UNIT/ML IJ SOLN
INTRAMUSCULAR | Status: AC
  Filled 2016-02-19: qty 4

## 2016-02-19 MED ORDER — SENNOSIDES-DOCUSATE SODIUM 8.6-50 MG PO TABS
2.0000 | ORAL_TABLET | ORAL | Status: DC
  Administered 2016-02-19 – 2016-02-21 (×2): 2 via ORAL
  Filled 2016-02-19 (×2): qty 2

## 2016-02-19 MED ORDER — LACTATED RINGERS IV SOLN: INTRAVENOUS | Status: DC

## 2016-02-19 MED ORDER — FENTANYL CITRATE (PF) 100 MCG/2ML IJ SOLN
INTRAMUSCULAR | Status: DC | PRN
  Administered 2016-02-19: 10 ug via INTRATHECAL

## 2016-02-19 MED ORDER — SODIUM CHLORIDE 0.9% FLUSH: 3.0000 mL | INTRAVENOUS | Status: DC | PRN

## 2016-02-19 MED ORDER — MORPHINE SULFATE-NACL 0.5-0.9 MG/ML-% IV SOSY
PREFILLED_SYRINGE | INTRAVENOUS | Status: AC
  Filled 2016-02-19: qty 1

## 2016-02-19 MED ORDER — OXYCODONE-ACETAMINOPHEN 5-325 MG PO TABS
1.0000 | ORAL_TABLET | ORAL | Status: DC | PRN
  Filled 2016-02-19: qty 1

## 2016-02-19 MED ORDER — KETOROLAC TROMETHAMINE 30 MG/ML IJ SOLN: 30.0000 mg | Freq: Four times a day (QID) | INTRAMUSCULAR | Status: AC | PRN

## 2016-02-19 MED ORDER — DIPHENHYDRAMINE HCL 25 MG PO CAPS: 25.0000 mg | ORAL_CAPSULE | ORAL | Status: DC | PRN

## 2016-02-19 MED ORDER — NALOXONE HCL 0.4 MG/ML IJ SOLN: 0.4000 mg | INTRAMUSCULAR | Status: DC | PRN

## 2016-02-19 MED ORDER — TETANUS-DIPHTH-ACELL PERTUSSIS 5-2.5-18.5 LF-MCG/0.5 IM SUSP: 0.5000 mL | Freq: Once | INTRAMUSCULAR | Status: DC

## 2016-02-19 MED ORDER — NALBUPHINE HCL 10 MG/ML IJ SOLN: 5.0000 mg | INTRAMUSCULAR | Status: DC | PRN

## 2016-02-19 MED ORDER — ONDANSETRON HCL 4 MG/2ML IJ SOLN: 4.0000 mg | Freq: Three times a day (TID) | INTRAMUSCULAR | Status: DC | PRN

## 2016-02-19 MED ORDER — MEPERIDINE HCL 25 MG/ML IJ SOLN: 6.2500 mg | INTRAMUSCULAR | Status: DC | PRN

## 2016-02-19 MED ORDER — ZOLPIDEM TARTRATE 5 MG PO TABS: 5.0000 mg | ORAL_TABLET | Freq: Every evening | ORAL | Status: DC | PRN

## 2016-02-19 MED ORDER — WITCH HAZEL-GLYCERIN EX PADS: 1.0000 "application " | MEDICATED_PAD | CUTANEOUS | Status: DC | PRN

## 2016-02-19 MED ORDER — DEXTROSE 5 % IV SOLN
1.0000 ug/kg/h | INTRAVENOUS | Status: DC | PRN
  Filled 2016-02-19: qty 2

## 2016-02-19 MED ORDER — LACTATED RINGERS IV SOLN
Freq: Once | INTRAVENOUS | Status: AC
  Administered 2016-02-19: 10:00:00 via INTRAVENOUS

## 2016-02-19 MED ORDER — ACETAMINOPHEN 325 MG PO TABS: 650.0000 mg | ORAL_TABLET | ORAL | Status: DC | PRN

## 2016-02-19 MED ORDER — CEFAZOLIN SODIUM-DEXTROSE 2-3 GM-% IV SOLR
INTRAVENOUS | Status: DC | PRN
  Administered 2016-02-19: 2 g via INTRAVENOUS

## 2016-02-19 MED ORDER — SCOPOLAMINE 1 MG/3DAYS TD PT72: 1.0000 | MEDICATED_PATCH | Freq: Once | TRANSDERMAL | Status: DC

## 2016-02-19 MED ORDER — PROMETHAZINE HCL 25 MG/ML IJ SOLN: 6.2500 mg | INTRAMUSCULAR | Status: DC | PRN

## 2016-02-19 MED ORDER — OXYTOCIN 10 UNIT/ML IJ SOLN
INTRAVENOUS | Status: DC | PRN
  Administered 2016-02-19: 40 [IU] via INTRAVENOUS

## 2016-02-19 MED ORDER — SODIUM CHLORIDE 0.9 % IR SOLN
Status: DC | PRN
  Administered 2016-02-19: 1000 mL

## 2016-02-19 MED ORDER — KETOROLAC TROMETHAMINE 30 MG/ML IJ SOLN: 30.0000 mg | Freq: Four times a day (QID) | INTRAMUSCULAR | Status: DC | PRN

## 2016-02-19 MED ORDER — LACTATED RINGERS IV SOLN
INTRAVENOUS | Status: DC
  Administered 2016-02-19: 23:00:00 via INTRAVENOUS

## 2016-02-19 MED ORDER — OXYTOCIN 40 UNITS IN LACTATED RINGERS INFUSION - SIMPLE MED: 2.5000 [IU]/h | INTRAVENOUS | Status: DC

## 2016-02-19 MED ORDER — SIMETHICONE 80 MG PO CHEW
80.0000 mg | CHEWABLE_TABLET | Freq: Three times a day (TID) | ORAL | Status: DC
  Administered 2016-02-19 – 2016-02-21 (×3): 80 mg via ORAL
  Filled 2016-02-19 (×4): qty 1

## 2016-02-19 MED ORDER — SCOPOLAMINE 1 MG/3DAYS TD PT72
1.0000 | MEDICATED_PATCH | Freq: Once | TRANSDERMAL | Status: DC
  Administered 2016-02-19: 1.5 mg via TRANSDERMAL

## 2016-02-19 MED ORDER — PHENYLEPHRINE 8 MG IN D5W 100 ML (0.08MG/ML) PREMIX OPTIME
INJECTION | INTRAVENOUS | Status: DC | PRN
  Administered 2016-02-19: 50 ug/min via INTRAVENOUS

## 2016-02-19 MED ORDER — DIPHENHYDRAMINE HCL 50 MG/ML IJ SOLN
12.5000 mg | INTRAMUSCULAR | Status: DC | PRN
  Administered 2016-02-19: 12.5 mg via INTRAVENOUS
  Filled 2016-02-19: qty 1

## 2016-02-19 MED ORDER — KETOROLAC TROMETHAMINE 30 MG/ML IJ SOLN
INTRAMUSCULAR | Status: AC
  Filled 2016-02-19: qty 1

## 2016-02-19 MED ORDER — BUPIVACAINE IN DEXTROSE 0.75-8.25 % IT SOLN
INTRATHECAL | Status: DC | PRN
  Administered 2016-02-19: 1.6 mL via INTRATHECAL

## 2016-02-19 MED ORDER — ONDANSETRON HCL 4 MG/2ML IJ SOLN
INTRAMUSCULAR | Status: DC | PRN
  Administered 2016-02-19: 4 mg via INTRAVENOUS

## 2016-02-19 MED ORDER — OXYCODONE-ACETAMINOPHEN 5-325 MG PO TABS: 2.0000 | ORAL_TABLET | ORAL | Status: DC | PRN

## 2016-02-19 MED ORDER — NALOXONE HCL 2 MG/2ML IJ SOSY
1.0000 ug/kg/h | PREFILLED_SYRINGE | INTRAMUSCULAR | Status: DC | PRN
  Filled 2016-02-19: qty 2

## 2016-02-19 MED ORDER — FENTANYL CITRATE (PF) 100 MCG/2ML IJ SOLN: 25.0000 ug | INTRAMUSCULAR | Status: DC | PRN

## 2016-02-19 MED ORDER — LACTATED RINGERS IV SOLN
INTRAVENOUS | Status: DC
  Administered 2016-02-19 (×4): via INTRAVENOUS

## 2016-02-19 MED ORDER — MENTHOL 3 MG MT LOZG: 1.0000 | LOZENGE | OROMUCOSAL | Status: DC | PRN

## 2016-02-19 MED ORDER — DIBUCAINE 1 % RE OINT: 1.0000 "application " | TOPICAL_OINTMENT | RECTAL | Status: DC | PRN

## 2016-02-19 MED ORDER — IBUPROFEN 600 MG PO TABS
600.0000 mg | ORAL_TABLET | Freq: Four times a day (QID) | ORAL | Status: DC
  Administered 2016-02-19 – 2016-02-21 (×6): 600 mg via ORAL
  Filled 2016-02-19 (×7): qty 1

## 2016-02-19 MED ORDER — PHENYLEPHRINE 8 MG IN D5W 100 ML (0.08MG/ML) PREMIX OPTIME
INJECTION | INTRAVENOUS | Status: AC
  Filled 2016-02-19: qty 100

## 2016-02-19 MED ORDER — SCOPOLAMINE 1 MG/3DAYS TD PT72
MEDICATED_PATCH | TRANSDERMAL | Status: AC
  Administered 2016-02-19: 1.5 mg via TRANSDERMAL
  Filled 2016-02-19: qty 1

## 2016-02-19 MED ORDER — NALBUPHINE HCL 10 MG/ML IJ SOLN: 5.0000 mg | Freq: Once | INTRAMUSCULAR | Status: DC | PRN

## 2016-02-19 MED ORDER — ONDANSETRON HCL 4 MG/2ML IJ SOLN
INTRAMUSCULAR | Status: AC
Start: 2016-02-19 — End: 2016-02-19
  Filled 2016-02-19: qty 2

## 2016-02-19 MED ORDER — PRENATAL MULTIVITAMIN CH
1.0000 | ORAL_TABLET | Freq: Every day | ORAL | Status: DC
  Administered 2016-02-21: 1 via ORAL
  Filled 2016-02-19 (×2): qty 1

## 2016-02-19 MED ORDER — DIPHENHYDRAMINE HCL 25 MG PO CAPS: 25.0000 mg | ORAL_CAPSULE | Freq: Four times a day (QID) | ORAL | Status: DC | PRN

## 2016-02-19 MED ORDER — KETOROLAC TROMETHAMINE 30 MG/ML IJ SOLN
30.0000 mg | Freq: Four times a day (QID) | INTRAMUSCULAR | Status: DC | PRN
  Administered 2016-02-19: 30 mg via INTRAMUSCULAR

## 2016-02-19 MED ORDER — FENTANYL CITRATE (PF) 100 MCG/2ML IJ SOLN
INTRAMUSCULAR | Status: AC
  Filled 2016-02-19: qty 2

## 2016-02-19 MED ORDER — SIMETHICONE 80 MG PO CHEW
80.0000 mg | CHEWABLE_TABLET | ORAL | Status: DC
  Administered 2016-02-19 – 2016-02-21 (×2): 80 mg via ORAL
  Filled 2016-02-19 (×3): qty 1

## 2016-02-19 MED ORDER — LACTATED RINGERS IV SOLN
INTRAVENOUS | Status: DC | PRN
  Administered 2016-02-19: 11:00:00 via INTRAVENOUS

## 2016-02-19 MED ORDER — SIMETHICONE 80 MG PO CHEW
80.0000 mg | CHEWABLE_TABLET | ORAL | Status: DC | PRN
  Administered 2016-02-19: 80 mg via ORAL

## 2016-02-19 MED ORDER — DIPHENHYDRAMINE HCL 50 MG/ML IJ SOLN: 12.5000 mg | INTRAMUSCULAR | Status: DC | PRN

## 2016-02-19 SURGICAL SUPPLY — 29 items
BENZOIN TINCTURE PRP APPL 2/3 (GAUZE/BANDAGES/DRESSINGS) ×3 IMPLANT
CHLORAPREP W/TINT 26ML (MISCELLANEOUS) ×3 IMPLANT
CLAMP CORD UMBIL (MISCELLANEOUS) ×9 IMPLANT
CLOSURE WOUND 1/2 X4 (GAUZE/BANDAGES/DRESSINGS) ×1
CLOTH BEACON ORANGE TIMEOUT ST (SAFETY) ×3 IMPLANT
DRSG OPSITE POSTOP 4X10 (GAUZE/BANDAGES/DRESSINGS) ×3 IMPLANT
ELECT REM PT RETURN 9FT ADLT (ELECTROSURGICAL) ×3
ELECTRODE REM PT RTRN 9FT ADLT (ELECTROSURGICAL) ×1 IMPLANT
GLOVE BIOGEL PI IND STRL 7.0 (GLOVE) ×2 IMPLANT
GLOVE BIOGEL PI IND STRL 7.5 (GLOVE) ×2 IMPLANT
GLOVE BIOGEL PI INDICATOR 7.0 (GLOVE) ×4
GLOVE BIOGEL PI INDICATOR 7.5 (GLOVE) ×4
GLOVE ECLIPSE 7.5 STRL STRAW (GLOVE) ×3 IMPLANT
GOWN STRL REUS W/TWL LRG LVL3 (GOWN DISPOSABLE) ×9 IMPLANT
NS IRRIG 1000ML POUR BTL (IV SOLUTION) ×3 IMPLANT
PACK C SECTION WH (CUSTOM PROCEDURE TRAY) ×3 IMPLANT
PAD ABD 8X7 1/2 STERILE (GAUZE/BANDAGES/DRESSINGS) ×6 IMPLANT
PAD OB MATERNITY 4.3X12.25 (PERSONAL CARE ITEMS) ×6 IMPLANT
PENCIL SMOKE EVAC W/HOLSTER (ELECTROSURGICAL) ×3 IMPLANT
RTRCTR C-SECT PINK 25CM LRG (MISCELLANEOUS) ×3 IMPLANT
STRIP CLOSURE SKIN 1/2X4 (GAUZE/BANDAGES/DRESSINGS) ×2 IMPLANT
SUT VIC AB 0 CTX 36 (SUTURE) ×6
SUT VIC AB 0 CTX36XBRD ANBCTRL (SUTURE) ×3 IMPLANT
SUT VIC AB 2-0 CT1 27 (SUTURE) ×2
SUT VIC AB 2-0 CT1 TAPERPNT 27 (SUTURE) ×1 IMPLANT
SUT VIC AB 4-0 KS 27 (SUTURE) ×3 IMPLANT
TAPE CLOTH SURG 4X10 WHT LF (GAUZE/BANDAGES/DRESSINGS) ×3 IMPLANT
TOWEL OR 17X24 6PK STRL BLUE (TOWEL DISPOSABLE) ×3 IMPLANT
TRAY FOLEY CATH SILVER 14FR (SET/KITS/TRAYS/PACK) ×3 IMPLANT

## 2016-02-19 NOTE — Addendum Note (Signed)
Addendum  created 02/19/16 1614 by Yolonda KidaAlison L Janaa Acero, CRNA   Sign clinical note

## 2016-02-19 NOTE — Anesthesia Postprocedure Evaluation (Signed)
Anesthesia Post Note  Patient: Jenna Walton  Procedure(s) Performed: Procedure(s) (LRB): CESAREAN SECTION MULTI-GESTATIONAL (N/A)  Patient location during evaluation: Mother Baby Anesthesia Type: Spinal Level of consciousness: awake, awake and alert, oriented and patient cooperative Pain management: pain level controlled Vital Signs Assessment: post-procedure vital signs reviewed and stable Respiratory status: spontaneous breathing, nonlabored ventilation and respiratory function stable Cardiovascular status: stable Postop Assessment: no headache, no backache, no signs of nausea or vomiting and patient able to bend at knees Anesthetic complications: no Comments: Husband utilized for translation.     Last Vitals:  Vitals:   02/19/16 1326 02/19/16 1450  BP: 116/66 (!) 104/54  Pulse: (!) 58 63  Resp: 16 18  Temp: 36.9 C 36.9 C    Last Pain:  Vitals:   02/19/16 1450  TempSrc: Oral  PainSc: 2    Pain Goal: Patients Stated Pain Goal: 4 (02/19/16 0921)               Germani Gavilanes L

## 2016-02-19 NOTE — Op Note (Signed)
Jenna Walton PROCEDURE DATE: 02/19/2016  PREOPERATIVE DIAGNOSIS: Intrauterine pregnancy at  5652w1d weeks gestation; twins  POSTOPERATIVE DIAGNOSIS: The same  PROCEDURE: Primary Low Transverse Cesarean Section  SURGEON:  Dr. Candelaria CelesteJacob Lulia Schriner  ASSISTANT: Dr Deforest HoylesNoah Wallace, Ennis FortsJessica Sibley MS3  INDICATIONS: Jenna Walton is a 25 y.o. G1P0000 at 6352w1d scheduled for cesarean section secondary to twins.  The risks of cesarean section discussed with the patient included but were not limited to: bleeding which may require transfusion or reoperation; infection which may require antibiotics; injury to bowel, bladder, ureters or other surrounding organs; injury to the fetus; need for additional procedures including hysterectomy in the event of a life-threatening hemorrhage; placental abnormalities wth subsequent pregnancies, incisional problems, thromboembolic phenomenon and other postoperative/anesthesia complications. The patient concurred with the proposed plan, giving informed written consent for the procedure.    FINDINGS:  Baby A: Viable female infant in vertex presentation.  Apgars 8 and 9, weight pending.  Clear amniotic fluid. Baby B: Viable female infant in vertex presentation.  Apgars 9 and 9, weight pending.  Clear amniotic fluid. Intact placenta, three vessel cord.  Normal uterus, fallopian tubes and ovaries bilaterally.  ANESTHESIA:    Spinal INTRAVENOUS FLUIDS: 2300 ml ESTIMATED BLOOD LOSS: 600 ml URINE OUTPUT:  200 ml SPECIMENS: Placenta sent to pathology COMPLICATIONS: None immediate  PROCEDURE IN DETAIL:  The patient received intravenous antibiotics and had sequential compression devices applied to her lower extremities while in the preoperative area.  She was then taken to the operating room where spinal anesthesia was administered and was found to be adequate. She was then placed in a dorsal supine position with a leftward tilt, and prepped and draped in a sterile manner.  A foley catheter was placed  into her bladder and attached to constant gravity, which drained clear fluid throughout.  After an adequate timeout was performed, a Pfannenstiel skin incision was made with scalpel and carried through to the underlying layer of fascia. The fascia was incised in the midline and this incision was extended bilaterally using the Mayo scissors. Kocher clamps were applied to the superior aspect of the fascial incision and the underlying rectus muscles were dissected off bluntly. The rectus muscles were separated in the midline bluntly and the peritoneum was entered bluntly. An Alexis retractor was placed to aid in visualization of the uterus.  Attention was turned to the lower uterine segment where a transverse hysterotomy was made with a scalpel and extended bilaterally bluntly. The infant was successfully delivered, and, after a minute of delay, cord was clamped and cut and infant was handed over to awaiting neonatology team. Baby B was delivered in a similar fashion. Uterine massage was then administered and the placenta delivered intact with three-vessel cord. The uterus was then cleared of clot and debris.  The hysterotomy was closed with 0 Vicryl in a running locked fashion, and an imbricating layer was also placed with a 0 Vicryl. Overall, excellent hemostasis was noted. The abdomen and the pelvis were cleared of all clot and debris and the Jon Gillslexis was removed. Hemostasis was confirmed on all surfaces.  The peritoneum was reapproximated using 2-0 vicryl running stitches. The fascia was then closed using 0 Vicryl in a running fashion. The skin was closed with 4-0 vicryl. The patient tolerated the procedure well. Sponge, lap, instrument and needle counts were correct x 2. She was taken to the recovery room in stable condition.

## 2016-02-19 NOTE — Anesthesia Procedure Notes (Addendum)
Spinal  Patient location during procedure: OR Start time: 02/19/2016 10:55 AM End time: 02/19/2016 10:57 AM Staffing Anesthesiologist: Suella Broad D Performed: other anesthesia staff  Preanesthetic Checklist Completed: patient identified, site marked, surgical consent, pre-op evaluation, timeout performed, IV checked, risks and benefits discussed and monitors and equipment checked Spinal Block Patient position: sitting Prep: Betadine Patient monitoring: heart rate, continuous pulse ox, blood pressure and cardiac monitor Approach: midline Location: L4-5 Injection technique: single-shot Needle Needle type: Introducer and Pencan  Needle gauge: 24 G Needle length: 9 cm Additional Notes Negative paresthesia. Negative blood return. Positive free-flowing CSF. Expiration date of kit checked and confirmed. Patient tolerated procedure well, without complications.

## 2016-02-19 NOTE — Plan of Care (Signed)
Problem: Nutritional: Goal: Mothers verbalization of comfort with breastfeeding process will improve Outcome: Progressing Br/ supp.

## 2016-02-19 NOTE — Anesthesia Preprocedure Evaluation (Addendum)
Anesthesia Evaluation  Patient identified by MRN, date of birth, ID band Patient awake    Reviewed: Allergy & Precautions, NPO status , Patient's Chart, lab work & pertinent test results  Airway Mallampati: I  TM Distance: >3 FB Neck ROM: Full    Dental  (+) Teeth Intact, Dental Advisory Given   Pulmonary neg pulmonary ROS,    breath sounds clear to auscultation       Cardiovascular negative cardio ROS   Rhythm:Regular Rate:Normal     Neuro/Psych negative neurological ROS  negative psych ROS   GI/Hepatic negative GI ROS, Neg liver ROS,   Endo/Other  negative endocrine ROS  Renal/GU negative Renal ROS  negative genitourinary   Musculoskeletal negative musculoskeletal ROS (+)   Abdominal   Peds negative pediatric ROS (+)  Hematology negative hematology ROS (+)   Anesthesia Other Findings   Reproductive/Obstetrics (+) Pregnancy                            Lab Results  Component Value Date   WBC 7.7 02/18/2016   HGB 9.1 (L) 02/18/2016   HCT 29.5 (L) 02/18/2016   MCV 82.6 02/18/2016   PLT 396 02/18/2016   No results found for: INR, PROTIME   Anesthesia Physical Anesthesia Plan  ASA: II  Anesthesia Plan: Spinal   Post-op Pain Management:    Induction:   Airway Management Planned: Natural Airway  Additional Equipment:   Intra-op Plan:   Post-operative Plan:   Informed Consent: I have reviewed the patients History and Physical, chart, labs and discussed the procedure including the risks, benefits and alternatives for the proposed anesthesia with the patient or authorized representative who has indicated his/her understanding and acceptance.     Plan Discussed with: CRNA  Anesthesia Plan Comments:         Anesthesia Quick Evaluation

## 2016-02-19 NOTE — Plan of Care (Signed)
"  Jenna Walton" interpreter used for change of shift report amongst on-going and off-going RNs - [facility](Vietmanese) with interaction of pt and FOB. Questions answered.

## 2016-02-19 NOTE — Anesthesia Postprocedure Evaluation (Signed)
Anesthesia Post Note  Patient: Precilla Seivert  Procedure(s) Performed: Procedure(s) (LRB): CESAREAN SECTION MULTI-GESTATIONAL (N/A)  Patient location during evaluation: PACU Anesthesia Type: Spinal Level of consciousness: awake and alert Pain management: pain level controlled Vital Signs Assessment: post-procedure vital signs reviewed and stable Respiratory status: spontaneous breathing, nonlabored ventilation, respiratory function stable and patient connected to nasal cannula oxygen Cardiovascular status: blood pressure returned to baseline and stable Postop Assessment: no signs of nausea or vomiting and spinal receding Anesthetic complications: no     Last Vitals:  Vitals:   02/19/16 1321 02/19/16 1326  BP:  116/66  Pulse:  (!) 58  Resp: 17 16  Temp:  36.9 C    Last Pain:  Vitals:   02/19/16 1326  TempSrc: Oral   Pain Goal: Patients Stated Pain Goal: 4 (02/19/16 0921)               Shelton SilvasKevin D Adoria Kawamoto

## 2016-02-19 NOTE — Lactation Note (Signed)
This note was copied from a baby's chart. Lactation Consultation Note  Patient Name: Jenna Walton Dieter ZOXWR'UToday's Date: 02/19/2016 Reason for consult: Initial assessment;Multiple gestation   Initial assessment with first time mom of 38w 1d GA twins in PACU. Interpreter was present in PACU for translating. FOB understands and speaks more English than MOB.  Infants were being weighed and measures. Infant A weight 5 lb 14.4 oz, Infant B 6 lb 2 oz. CBG's are pending. Mom reports a little breast growth with pregnancy.  Mom with small compressible breasts and everted nipples. Colostrum was expressible from both breasts.   Placed infant's STS, neither were interested in latching initially. Spoon fed both infants 2 cc colostrum via spoon. After about 15 minutes, Infant A then latched on and off and fed for about 5 minutes. Neither infant with strong suck on gloved finger. Twin B is noted to have a short lingual frenulum that did not allow for full elevation of tongue to roof of mouth. Twin A was noted to have tongue back behind gumline and tongue thrusting when suckling on gloved finger. Dad was very helpful in assisting with feeding infants. He held infant at breast and massaged breast with feeding. Mom was shown to hand express but will need assistance with at a later time.   Enc mom to feed infants at least every 3 hours. Enc family to feed one infant at a time for now until infants are better at BF. Enc family to hand express and spoon feed infant Colostrum as available. Discussed with mom that pumping may need to be started if infant's not feeding well and that if not enough colostrum is obtained formula may need to be used, mom and dad voiced understanding. Feeding log given with instructions for use.   BF Resource Handout and LC Brochure given, parents informed of IP/OP Services, BF Support Groups and LC phone #. Enc parents to call out to desk for feeding assistance as needed.    Maternal Data Formula  Feeding for Exclusion: No Has patient been taught Hand Expression?: Yes Does the patient have breastfeeding experience prior to this delivery?: No  Feeding Feeding Type: Breast Fed Length of feed: 5 min  LATCH Score/Interventions Latch: Repeated attempts needed to sustain latch, nipple held in mouth throughout feeding, stimulation needed to elicit sucking reflex. Intervention(s): Breast compression;Breast massage;Assist with latch;Adjust position  Audible Swallowing: None  Type of Nipple: Everted at rest and after stimulation  Comfort (Breast/Nipple): Soft / non-tender     Hold (Positioning): Full assist, staff holds infant at breast Intervention(s): Breastfeeding basics reviewed;Support Pillows;Position options;Skin to skin  LATCH Score: 5  Lactation Tools Discussed/Used WIC Program: Yes   Consult Status Consult Status: Follow-up Date: 02/19/16 Follow-up type: In-patient    Silas FloodSharon S Jakai Risse 02/19/2016, 1:16 PM

## 2016-02-19 NOTE — Progress Notes (Signed)
Admission paper work done via live interpreter. All questions answered and plan of care explained. Call, emergency button and pain med plan explained.  Mother and father taught how to call if they notice infant having color changes. Bulb suctioning explained.

## 2016-02-19 NOTE — Transfer of Care (Signed)
Immediate Anesthesia Transfer of Care Note  Patient: Jenna Walton  Procedure(s) Performed: Procedure(s): CESAREAN SECTION MULTI-GESTATIONAL (N/A)  Patient Location: PACU  Anesthesia Type:Spinal  Level of Consciousness: awake, alert , oriented and patient cooperative  Airway & Oxygen Therapy: Patient Spontanous Breathing  Post-op Assessment: Report given to RN and Post -op Vital signs reviewed and stable  Post vital signs: Reviewed and stable  Last Vitals:  Vitals:   02/19/16 0921  BP: 118/83  Pulse: 83  Resp: 18  Temp: 36.6 C    Last Pain:  Vitals:   02/19/16 0921  TempSrc: Oral      Patients Stated Pain Goal: 4 (02/19/16 0921)  Complications: No apparent anesthesia complications

## 2016-02-19 NOTE — H&P (Signed)
Faculty Practice H&P  Jenna Walton is a 25 y.o. female G1P0000 with IUP at 2165w1d presenting for primary cesarean section for twins. Pregnancy was been complicated by twins.    Pt states she has been having no contractions, no vaginal bleeding, intact membranes, with normal fetal movement.     Prenatal Course [facility] Pregnancy complications or risks: Patient Active Problem List   Diagnosis Date Noted  . Language barrier 12/30/2015  . Dichorionic diamniotic twin pregnancy 10/16/2015  . Supervision of high risk pregnancy, antepartum 10/16/2015   She desires to condoms.  She plans to plans to breastfeed  Prenatal labs and studies: ABO, Rh: --/--/O POS, O POS (12/12 1020) Antibody: NEG (12/12 1020) Rubella: !Error! RPR: Non Reactive (12/12 1020)  HBsAg: Negative (06/29 0000)  HIV: NONREACTIVE (09/25 0931)  GBS:    1 hr Glucola 101 Genetic screening normal Anatomy US normal  Past Medical History:  Past Medical History:  Diagnosis Date  . Medical history non-contributory     Past Surgical History:  Past Surgical History:  Procedure Laterality Date  . NO PAST SURGERIES      Obstetrical History:  OB History    Gravida Para Term Preterm AB Living   1 0 0 0 0 0   SAB TAB Ectopic Multiple Live Births   0 0 0 0 0      Gynecological History:  OB History    Gravida Para Term Preterm AB Living   1 0 0 0 0 0   SAB TAB Ectopic Multiple Live Births   0 0 0 0 0      Social History:  Social History   Social History  . Marital status: Married    Spouse name: N/A  . Number of children: N/A  . Years of education: N/A   Social History Main Topics  . Smoking status: Never Smoker  . Smokeless tobacco: Never Used  . Alcohol use No  . Drug use: No  . Sexual activity: Yes   Other Topics Concern  . None   Social History Narrative  . None    Family History: History reviewed. No pertinent family history.  Medications:  Prenatal  vitamins,  Current Facility-Administered Medications  Medication Dose Route Frequency Provider Last Rate Last Dose  . lactated ringers infusion   Intravenous Continuous Shelton SilvasKevin D Hollis, MD      . lactated ringers infusion   Intravenous Once Shelton SilvasKevin D Hollis, MD      . scopolamine (TRANSDERM-SCOP) 1 MG/3DAYS 1.5 mg  1 patch Transdermal Once Shelton SilvasKevin D Hollis, MD   1.5 mg at 02/19/16 0915    Allergies: No Known Allergies  Review of Systems: - negative  Physical Exam: Blood pressure 118/83, pulse 83, temperature 97.8 F (36.6 C), temperature source Oral, resp. rate 18, last menstrual period 05/28/2015, SpO2 100 %. GENERAL: Well-developed, well-nourished female in no acute distress.  LUNGS: Clear to auscultation bilaterally.  HEART: Regular rate and rhythm. ABDOMEN: Soft, nontender, nondistended, gravid. EFW 6.5 lbs EXTREMITIES: Nontender, no edema, 2+ distal pulses. FHT: 125/130    Pertinent Labs/Studies:   CBC    Component Value Date/Time   WBC 7.7 02/18/2016 1020   RBC 3.57 (L) 02/18/2016 1020   HGB 9.1 (L) 02/18/2016 1020   HGB 12.7 09/05/2015   HCT 29.5 (L) 02/18/2016 1020   HCT 37 09/05/2015   PLT 396 02/18/2016 1020   PLT 255 09/05/2015   MCV 82.6 02/18/2016 1020  MCH 25.5 (L) 02/18/2016 1020   MCHC 30.8 02/18/2016 1020   RDW 15.5 02/18/2016 1020     Assessment : Jenna Maffuccihi Sloniker is a 25 y.o. G1P0000 at 7743w1d being admitted for cesarean section secondary to twins  Plan: The risks of cesarean section discussed with the patient included but were not limited to: bleeding which may require transfusion or reoperation; infection which may require antibiotics; injury to bowel, bladder, ureters or other surrounding organs; injury to the fetus; need for additional procedures including hysterectomy in the event of a life-threatening hemorrhage; placental abnormalities wth subsequent pregnancies, incisional problems, thromboembolic phenomenon and other postoperative/anesthesia complications.  The patient concurred with the proposed plan, giving informed written consent for the procedure.   Patient has been NPO since last night and will remain NPO for procedure.  Preoperative prophylactic Ancef ordered on call to the OR.    Levie HeritageJacob J Gavin Telford, DO 02/19/2016, 9:33 AM

## 2016-02-20 LAB — PREPARE RBC (CROSSMATCH)

## 2016-02-20 LAB — CBC
HCT: 20.3 % — ABNORMAL LOW (ref 36.0–46.0)
Hemoglobin: 6.6 g/dL — CL (ref 12.0–15.0)
MCH: 26.3 pg (ref 26.0–34.0)
MCHC: 32.5 g/dL (ref 30.0–36.0)
MCV: 80.9 fL (ref 78.0–100.0)
PLATELETS: 273 10*3/uL (ref 150–400)
RBC: 2.51 MIL/uL — AB (ref 3.87–5.11)
RDW: 15.8 % — AB (ref 11.5–15.5)
WBC: 10.3 10*3/uL (ref 4.0–10.5)

## 2016-02-20 MED ORDER — FERROUS SULFATE 325 (65 FE) MG PO TABS
325.0000 mg | ORAL_TABLET | Freq: Every day | ORAL | Status: DC
  Administered 2016-02-20 – 2016-02-21 (×2): 325 mg via ORAL
  Filled 2016-02-20 (×3): qty 1

## 2016-02-20 NOTE — Progress Notes (Signed)
Dr. Cyril LoosenSchnek notified of patient needing a post transfusion CBC. He stated it was ok to put a CBC in for the am.

## 2016-02-20 NOTE — Progress Notes (Addendum)
CRITICAL VALUE ALERT  Critical value received:  Hemoglobin: 6.6  Date of notification:  02/20/16  Time of notification:  0629  Critical value read back:Yes.    Nurse who received alert:  Mont DuttonW. Mccartney Chuba, RN  MD notified (1st page): called Dr. Nelson ChimesAmin @  [phone]  Time of first page:  0645  MD notified (2nd page):  Time of second page:  Responding MD: Dr. Nelson ChimesAmin   Time MD responded:  [phone]  No new orders received

## 2016-02-20 NOTE — Progress Notes (Signed)
POSTPARTUM PROGRESS NOTE  Post Partum Day/POD #1 Subjective:  Jenna Walton is a 25 y.o. G1P1002 7469w1d s/p PLTCS for twin gestation.  No acute events overnight.  Pt denies problems with ambulating, voiding or po intake.  She denies nausea or vomiting.  Pain is well controlled.  She has had flatus. She has not had bowel movement.  Lochia Minimal.   Objective: Blood pressure (!) 101/58, pulse 62, temperature 98.3 F (36.8 C), temperature source Oral, resp. rate 16, height 5\' 1"  (1.549 m), weight 141 lb (64 kg), last menstrual period 05/28/2015, SpO2 98 %, unknown if currently breastfeeding.  Physical Exam:  General: alert, cooperative and no distress Lochia:normal flow Chest: CTAB Heart: RRR no m/r/g Abdomen: +BS, soft, nontender,  Uterine Fundus: firm, intact DVT Evaluation: No calf swelling or tenderness Incision: clean/dry/intact Extremities: no edema   Recent Labs  02/18/16 1020 02/20/16 0541  HGB 9.1* 6.6*  HCT 29.5* 20.3*    Assessment/Plan:  ASSESSMENT: Jenna Walton is a 25 y.o. [id number] 7669w1d s/p PLTCS for twin gestation  Plan for discharge tomorrow and Breastfeeding/bottle feeding . Undecided currently on contraception   LOS: 1 day   Berniece SalinesWALLACE, NOAH I, DO PGY-3 [facility]  02/20/2016, 7:46 AM   OB FELLOW POSTPARTUM PROGRESS NOTE ATTESTATION  I have seen and examined this patient and agree with above documentation in the resident's note. Her Hb is down to 6.6, but currently is asymptomatic, BP stable, no tachycardia. Will reevaluate patient when she is up and walking more, if becomes symptomatic anemic, will transfuse 2 units PRBC.  Jen MowElizabeth Lavera Vandermeer, DO OB Fellow 9:26 AM

## 2016-02-20 NOTE — Progress Notes (Signed)
Blood administration education done via interpreter. MD Dr. Earlene PlaterWallace went over risk factors with patient.

## 2016-02-20 NOTE — Progress Notes (Signed)
MD called to check on pt's status and vital signs - Awaiting new orders to be entered by MD.

## 2016-02-20 NOTE — Progress Notes (Signed)
UR chart review completed.  

## 2016-02-20 NOTE — Progress Notes (Signed)
Dr. Earlene PlaterWallace notified of hemaglobin of 6.6 and seen BP of 108/67 and pulse of 72. He was updated on patient's status.  Patient is pale but is not dizzy and has been up to bathroom but has not voided. Pressure dressing off and honeycomb intact . Will get live interpreter to go over plan of care.

## 2016-02-20 NOTE — Progress Notes (Signed)
2 units tranfused without difficulty.

## 2016-02-20 NOTE — Lactation Note (Signed)
This note was copied from a baby's chart. Lactation Consultation Note  Twins 7427 hours old.  Upon entering, FOB giving baby A bottle of formula. FOB states baby recently bf for 5 min before offering formula.  Baby was also given approx 5 ml of colostrum on spoon prior to formula. FOB states baby B also bf for approx 5 min and then was given 12 ml of formula. Discussed unwrapping babies for feedings to wake and suggest calling for assistance with breastfeeding. Mother pumped last at 1330.  Suggest she pump again within 3 hours.  Mother agreeable. Discussed volume guidelines and increasing per day of life.  FOB states Baby A is more sleepy than Baby B. Reviewed waking techniques.  Patient Name: Jenna Walton GLOVF'IToday's Date: 02/20/2016     Maternal Data    Feeding Feeding Type: Breast Fed Length of feed: 5 min  LATCH Score/Interventions                      Lactation Tools Discussed/Used     Consult Status      Hardie PulleyBerkelhammer, Ruth Boschen 02/20/2016, 3:22 PM

## 2016-02-21 LAB — TYPE AND SCREEN
ABO/RH(D): O POS
ANTIBODY SCREEN: NEGATIVE
UNIT DIVISION: 0
UNIT DIVISION: 0

## 2016-02-21 LAB — CBC
HCT: 25.9 % — ABNORMAL LOW (ref 36.0–46.0)
HEMOGLOBIN: 8.6 g/dL — AB (ref 12.0–15.0)
MCH: 27.2 pg (ref 26.0–34.0)
MCHC: 33.2 g/dL (ref 30.0–36.0)
MCV: 82 fL (ref 78.0–100.0)
PLATELETS: 332 10*3/uL (ref 150–400)
RBC: 3.16 MIL/uL — ABNORMAL LOW (ref 3.87–5.11)
RDW: 16 % — AB (ref 11.5–15.5)
WBC: 11.7 10*3/uL — ABNORMAL HIGH (ref 4.0–10.5)

## 2016-02-21 MED ORDER — FERROUS SULFATE 325 (65 FE) MG PO TABS: 325.0000 mg | ORAL_TABLET | Freq: Every day | ORAL | 3 refills | Status: AC

## 2016-02-21 MED ORDER — OXYCODONE-ACETAMINOPHEN 5-325 MG PO TABS: 1.0000 | ORAL_TABLET | ORAL | 0 refills | Status: DC | PRN

## 2016-02-21 MED ORDER — IBUPROFEN 600 MG PO TABS: 600.0000 mg | ORAL_TABLET | Freq: Four times a day (QID) | ORAL | 0 refills | Status: DC | PRN

## 2016-02-21 NOTE — Lactation Note (Signed)
This note was copied from a baby's chart. Lactation Consultation Note Mom w/twins. Supplementing w/formula. Hand expression w/no colostrum noted at this time. Encouraged to BF first then supplement. Mom is to cont. Post-pumping. Need interpreter for exstensive teaching.  Patient Name: Jenna Walton XBJYN'WToday's Date: 02/21/2016 Reason for consult: Follow-up assessment;NICU baby;Infant < 6lbs   Maternal Data    Feeding Feeding Type: Bottle Fed - Formula Nipple Type: Slow - flow  LATCH Score/Interventions    Intervention(s): Hand expression;Alternate breast massage  Type of Nipple: Everted at rest and after stimulation  Comfort (Breast/Nipple): Soft / non-tender           Lactation Tools Discussed/Used Pump Review: Setup, frequency, and cleaning;Milk Storage Initiated by:: RN Date initiated:: 02/19/16   Consult Status Consult Status: Follow-up Date: 02/21/16 Follow-up type: In-patient    Darlen Gledhill, Diamond NickelLAURA G 02/21/2016, 3:31 AM

## 2016-02-21 NOTE — Discharge Summary (Signed)
OB Discharge Summary     Patient Name: Jenna Walton DOB: 06/14/1990 MRN: [id number]  Date of admission: 02/19/2016 Delivering MD:    Thressa ShellerCao, GirlA Telecia [[id number]][[id number]]  Chipper OmanSTINSON, JACOB J   Kiraly, GirlB Dnasia [[id number]][[id number]]  Levie HeritageSTINSON, JACOB J   Date of discharge: 02/21/2016  Admitting diagnosis: cpt [phone] - Twin pregnancy Intrauterine pregnancy: 10857w1d     Secondary diagnosis:  Active Problems:   Status post cesarean delivery  Additional problems: none     Discharge diagnosis: Term Pregnancy Delivered                                                                                                Post partum procedures:blood transfusion  Augmentation: N/A  Complications: None  Hospital course:  Sceduled C/S   25 y.o. yo G1P1002 at 6757w1d was admitted to the hospital 02/19/2016 for scheduled cesarean section with the following indication:Elective Primary for twins.  Membrane Rupture Time/Date:    Thressa ShellerCao, GirlA Kayra [[id number]][[id number]]  11:11 AM   Joni Fearsao, GirlB Nikcole [[id number]][[id number]]  11:12 AM ,   Thressa ShellerCao, GirlA Monzerat [[id number]][[id number]]  02/19/2016   Eula FlaxCao, GirlB Keerstin [[id number]][[id number]]  02/19/2016   Patient delivered a Viable infant.   Thressa ShellerCao, GirlA Linzi [[id number]][[id number]]  02/19/2016   Eula FlaxCao, GirlB Tessa [[id number]][[id number]]  02/19/2016  Details of operation can be found in separate operative note.  Pateint had an uncomplicated postpartum course other than receiving 2u PRBCs on POD#1 due to postop anemia.  She is ambulating, tolerating a regular diet, passing flatus, and urinating well on POD#2; denies dizziness or s/s anemia. Patient is discharged home in stable condition on  02/21/16          Physical exam Vitals:   02/20/16 1500 02/20/16 1530 02/20/16 1745 02/21/16 0541  BP: 103/62 102/60 115/65 111/62  Pulse: 75 72 72 (!) 55  Resp:    18  Temp: 98 F (36.7 C) 97.6 F (36.4 C) 98.2 F (36.8 C) 98.2 F (36.8 C)  TempSrc:  Oral Oral Oral  SpO2: 100% 98% 99%   Weight:      Height:       General: alert and cooperative Lochia:  appropriate Uterine Fundus: firm Incision: honeycomb intact; sm marked drainage DVT Evaluation: No evidence of DVT seen on physical exam. Labs: Lab Results  Component Value Date   WBC 11.7 (H) 02/21/2016   HGB 8.6 (L) 02/21/2016   HCT 25.9 (L) 02/21/2016   MCV 82.0 02/21/2016   PLT 332 02/21/2016   No flowsheet data found.  Discharge instruction: per After Visit Summary and "Baby and Me Booklet".  After visit meds:  Allergies as of 02/21/2016   No Known Allergies     Medication List    TAKE these medications   ferrous sulfate 325 (65 FE) MG tablet Take 1 tablet (325 mg total) by mouth daily with breakfast. Start taking on:  02/22/2016   ibuprofen 600 MG tablet Commonly known as:  ADVIL,MOTRIN Take 1 tablet (600 mg total) by mouth every 6 (six) hours as needed.   multivitamin-prenatal 27-0.8 MG Tabs tablet Take 1 tablet by mouth  daily at 12 noon.   oxyCODONE-acetaminophen 5-325 MG tablet Commonly known as:  PERCOCET/ROXICET Take 1 tablet by mouth every 4 (four) hours as needed (pain scale 4-7).       Diet: routine diet  Activity: Advance as tolerated. Pelvic rest for 6 weeks.   Outpatient follow up:4 weeks Follow up Appt:Future Appointments Date Time Provider Department Center  03/23/2016 10:20 AM Judeth HornErin Lawrence, NP [facility]   Follow up Visit:No Follow-up on file.  Postpartum contraception: Condoms  Newborn Data:   Thressa ShellerCao, GirlA Lexi [[id number]][[id number]]  Live born female  Birth Weight: 5 lb 14.4 oz (2675 g) APGAR: 8, 9   Eula FlaxCao, GirlB Yareliz [[id number]][[id number]]  Live born female  Birth Weight: 6 lb 2.2 oz (2785 g) APGAR: 9, 9  Baby Feeding: Bottle and Breast Disposition:home with mother   02/21/2016 Cam HaiSHAW, KIMBERLY, CNM  10:29 AM

## 2016-02-21 NOTE — Progress Notes (Signed)
DC teaching all completed via live interpreter. All questions answered. Prescriptions given. Patient understands she will no longer be a patient and will not be able to have medicines.

## 2016-02-21 NOTE — Progress Notes (Signed)
Live interpreter used to answer all questions and explain plan of care to patient. Mother encouraged to call out for pain meds if needed her pain now 5 out of 10 and does not want any pain medicine via interpreter. Mother also told to ambulate halls and she was updated on her labs this am and hemaglobin of 8.6. Feeding schedules for twins expalined .Marland Kitchen.Patient stated she understood plan of care and had no questions.

## 2016-02-21 NOTE — Progress Notes (Signed)
Used phone interpreter ID # [id number] to communicate with mother. Babies needed to have PKU and heart screen done. Mother agreed and said she understood. Questions were answered at this time. Pt declined any needs at this time.

## 2016-02-21 NOTE — Progress Notes (Signed)
Patient told to take off dressing in 3 days and incisional teaching completed.

## 2016-02-22 ENCOUNTER — Ambulatory Visit: Payer: Self-pay

## 2016-02-22 NOTE — Lactation Note (Signed)
This note was copied from a baby's chart. Lactation Consultation Note  Patient Name: Thressa ShellerGirlA Tinlee Mciver ZOXWR'UToday's Date: 02/22/2016 Reason for consult: Follow-up assessment;Multiple gestation;Infant < 6lbs [employer] interpreter [phone] used for visit. Mom reports she is BF for about 5 minutes each feeding then supplementing babies with formula. She reports pumping every hour. Mom declined assist with latch before d/c home, ready to go home and reports she "knows how to BF". Encouraged to try and keep babies at breast for 15-30 minutes then supplement and pump. Advised Mom to pump every 3 hours for 15 minutes to encourage milk production and protect milk supply till babies are latching better. Mom reports she has pump at home. Engorgement care reviewed if needed, advised to refer to Baby N Me booklet, page 24, page 25 for breast milk storage guidelines. Advised of OP services, offered to schedule f/u to help with latch, Mom declined. Encouraged to call for questions/concerns.   Maternal Data    Feeding Feeding Type: Formula Nipple Type: Slow - flow  LATCH Score/Interventions                      Lactation Tools Discussed/Used     Consult Status Consult Status: Complete Date: 02/22/16 Follow-up type: In-patient    Alfred LevinsGranger, Haruo Stepanek Ann 02/22/2016, 12:18 PM

## 2016-03-23 ENCOUNTER — Ambulatory Visit (INDEPENDENT_AMBULATORY_CARE_PROVIDER_SITE_OTHER): Payer: Medicaid Other | Admitting: Student

## 2016-03-23 ENCOUNTER — Encounter: Payer: Self-pay | Admitting: Student

## 2016-03-23 NOTE — Patient Instructions (Signed)

## 2016-03-23 NOTE — Progress Notes (Signed)
Subjective:   Used [employer] interpreter [phone].   Jenna Maffuccihi Walton is a 26 y.o. female who presents for a postpartum visit. She is 4 weeks postpartum following a low cervical transverse Cesarean section. I have fully reviewed the prenatal and intrapartum course. The delivery was at 38 gestational weeks. Outcome: primary cesarean section, low transverse incision. Anesthesia: spinal. Postpartum course has been normal. Baby's course has been normal. Baby is feeding by both breast and bottle. Bleeding no bleeding. Bowel function is normal. Bladder function is normal. Patient is sexually active. Contraception method is condoms. Postpartum depression screening: negative.  The following portions of the patient's history were reviewed and updated as appropriate: allergies, current medications, past family history, past medical history, past social history, past surgical history and problem list.  Review of Systems A comprehensive review of systems was negative.   Objective:    BP 120/75   Pulse 68   Wt 111 lb 4.8 oz (50.5 kg)   Breastfeeding? Yes   BMI 21.03 kg/m   General:  alert, cooperative, appears stated age and no distress  Lungs: clear to auscultation bilaterally  Heart:  regular rate and rhythm, S1, S2 normal, no murmur, click, rub or gallop  Abdomen: soft, non-tender; bowel sounds normal; no masses,  no organomegaly and well healed c/section incision        Assessment:     Normal postpartum exam. Pap smear not done at today's visit.   Plan:    1. Contraception: condoms 2. Follow up in: 1 year for routine gyn exam; pap smear due in 2 years or as needed.     Judeth HornErin Belvin Gauss, NP

## 2018-02-08 IMAGING — US US MFM OB DETAIL EACH ADDL GEST+14 WK
1 series · 12 of 28 positions shown · non-contrast
Comparison: none

[Series 1: us mfm ob detail each addl gest+14 wk · 214 acquisitions, 12 frames shown]
[im 8/214]
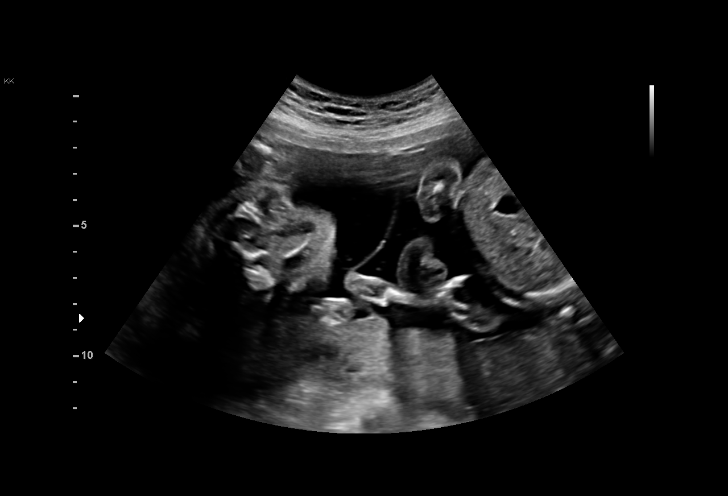
[im 24/214]
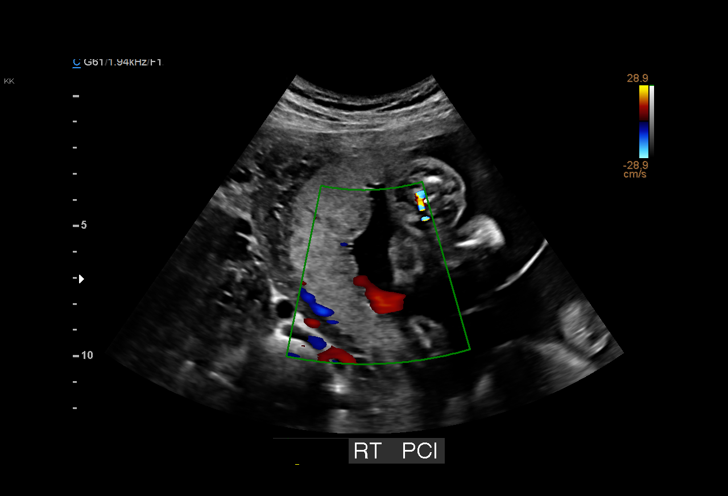
[im 40/214]
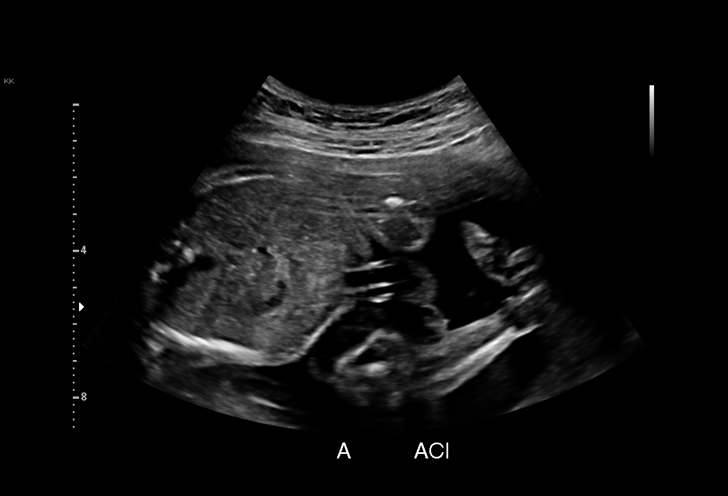
[im 64/214]
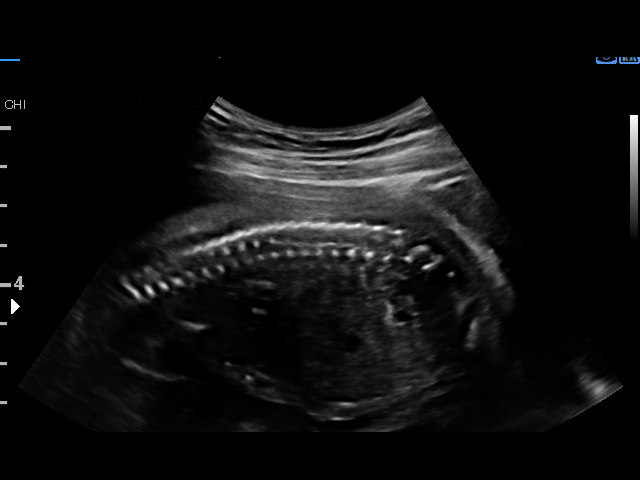
[im 79/214]
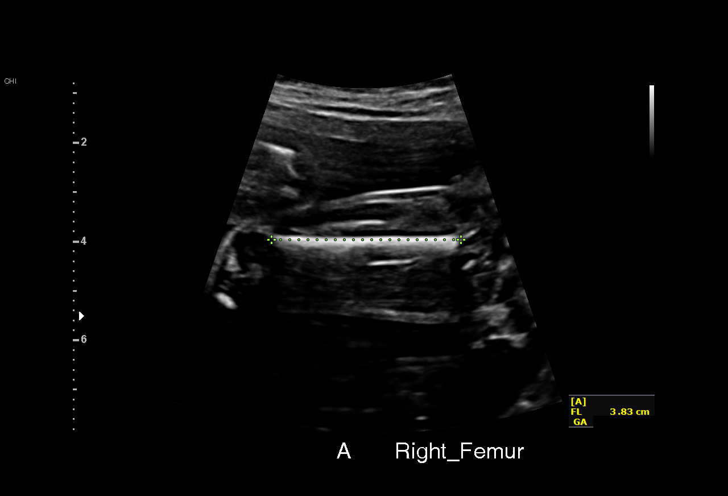
[im 95/214]
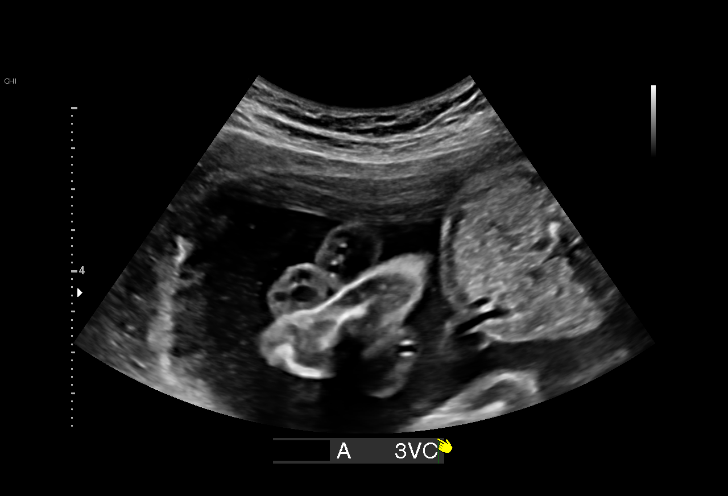
[im 119/214]
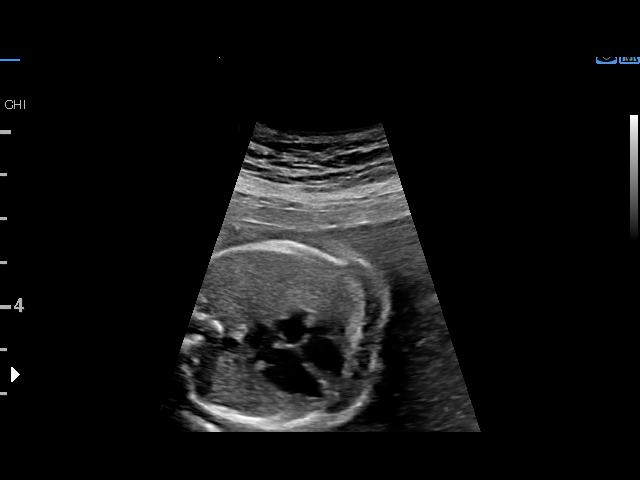
[im 135/214]
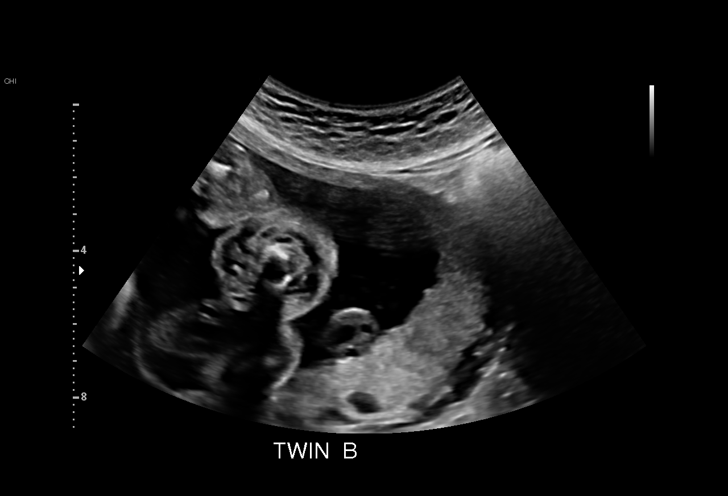
[im 150/214]
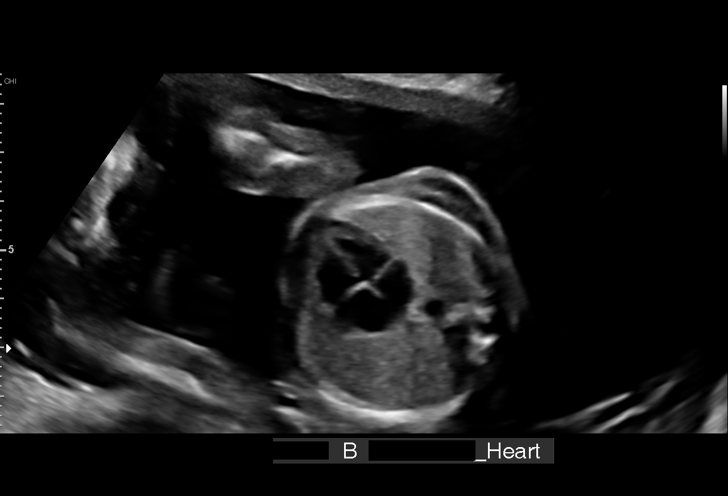
[im 174/214]
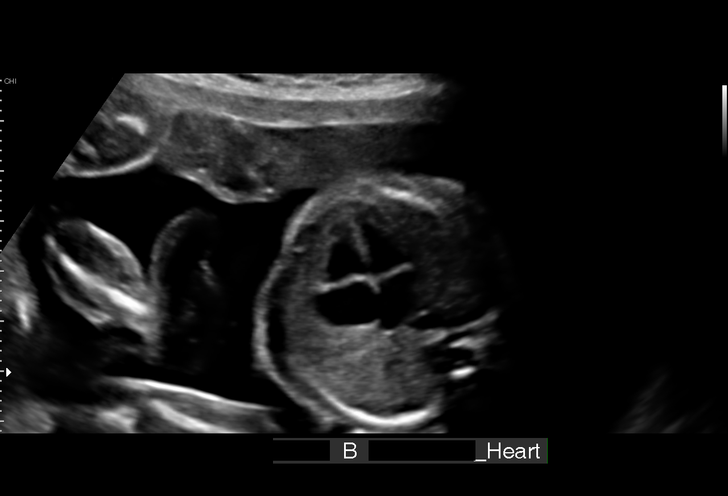
[im 190/214]
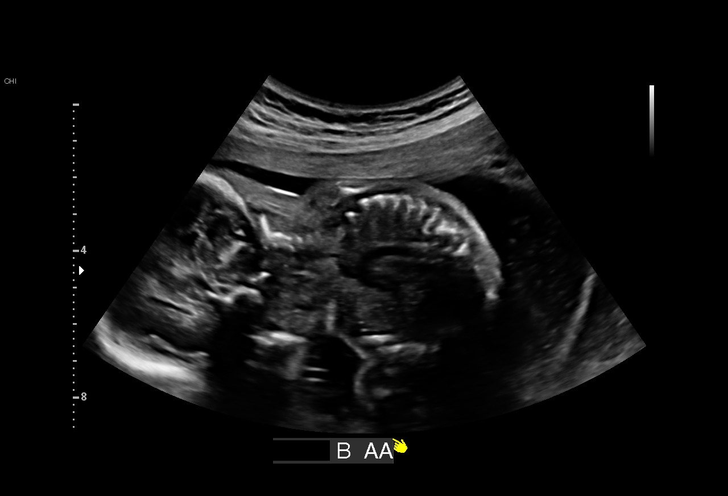
[im 206/214]
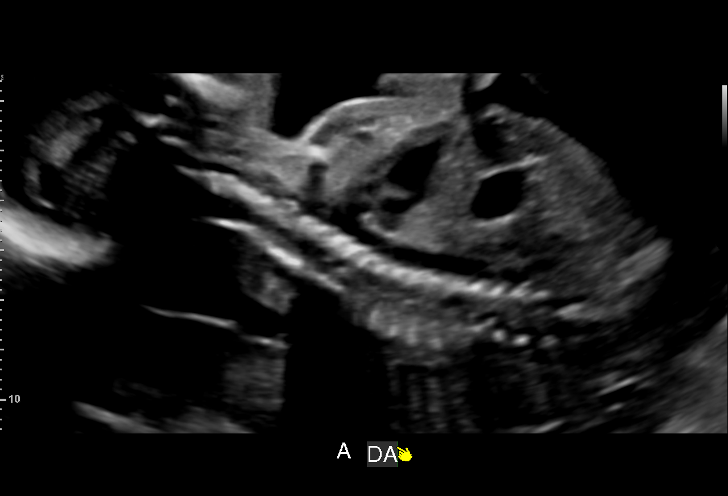

[12 of 28 positions shown; findings below may reference images not displayed]

WK

1  LR CHAMORRO             [id number]      [id number]     [id number]
2  LR CHAMORRO             [id number]      [id number]     [id number]
Indications

22 weeks gestation of pregnancy
Basic anatomic survey                          Z36
Twin pregnancy, di/di, second trimester
OB History

Gravidity:    1
Fetal Evaluation (Fetus A)

Num Of Fetuses:     2
Fetal Heart         169
Rate(bpm):
Cardiac Activity:   Observed
Fetal Lie:          Right Fetus
Presentation:       Breech
Placenta:           Posterior, above cervical os
P. Cord Insertion:  Visualized, central

Amniotic Fluid
AFI FV:      Subjectively within normal limits

Largest Pocket(cm)
4.56
Biometry (Fetus A)
BPD:      56.5  mm     G. Age:  23w 2d         61  %    CI:        78.64   %    70 - 86
FL/HC:       18.9  %    19.2 -
HC:      201.5  mm     G. Age:  22w 2d         17  %    HC/AC:       1.14       1.05 -
AC:      176.5  mm     G. Age:  22w 4d         32  %    FL/BPD:      67.4  %    71 - 87
FL:       38.1  mm     G. Age:  22w 2d         20  %    FL/AC:       21.6  %    20 - 24
HUM:      33.3  mm     G. Age:  21w 2d         10  %
CER:      22.8  mm     G. Age:  21w 3d         19  %

Est. FW:     502   gm     1 lb 2 oz     43  %     FW Discordancy      0 \ 6 %
Gestational Age (Fetus A)

LMP:           22w 6d        Date:  05/28/15                 EDD:   03/03/16
U/S Today:     22w 4d                                        EDD:   03/05/16
Best:          22w 6d     Det. By:  LMP  (05/28/15)          EDD:   03/03/16
Anatomy (Fetus A)

Cranium:               Appears normal         Aortic Arch:            Appears normal
Cavum:                 Appears normal         Ductal Arch:            Appears normal
Ventricles:            Appears normal         Diaphragm:              Appears normal
Choroid Plexus:        Appears normal         Stomach:                Appears normal, left
sided
Cerebellum:            Appears normal         Abdomen:                Appears normal
Posterior Fossa:       Appears normal         Abdominal Wall:         Appears nml (cord
insert, abd wall)
Nuchal Fold:           Not applicable (>20    Cord Vessels:           Appears normal (3
wks GA)                                        vessel cord)
Face:                  Appears normal         Kidneys:                Appear normal
(orbits and profile)
Lips:                  Appears normal         Bladder:                Appears normal
Thoracic:              Appears normal         Spine:                  Appears normal
Heart:                 Appears normal         Upper Extremities:      Appears normal
(4CH, axis, and situs
RVOT:                  Not well visualized    Lower Extremities:      Appears normal
LVOT:                  Not well visualized

Other:  Fetus appears to be a female.  Rt heel and 5th digit visualized.
Technically difficult due to fetal position.

Fetal Evaluation (Fetus B)

Num Of Fetuses:     2
Fetal Heart         157
Rate(bpm):
Cardiac Activity:   Observed
Presentation:       Variable
Placenta:           Posterior, above cervical os
P. Cord Insertion:  Visualized, central

Amniotic Fluid
AFI FV:      Subjectively within normal limits

Largest Pocket(cm)
3.66
Biometry (Fetus B)
BPD:      56.3  mm     G. Age:  23w 1d         59  %    CI:        77.26   %    70 - 86
FL/HC:       17.3  %    19.2 -
HC:      202.8  mm     G. Age:  22w 3d         21  %    HC/AC:       1.14       1.05 -
AC:      177.2  mm     G. Age:  22w 4d         34  %    FL/BPD:      62.3  %    71 - 87
FL:       35.1  mm     G. Age:  21w 0d          3  %    FL/AC:       19.8  %    20 - 24
HUM:      34.3  mm     G. Age:  21w 5d         20  %
CER:      23.8  mm     G. Age:  21w 6d         34  %
Est. FW:     470   gm     1 lb 1 oz     31  %     FW Discordancy         6  %
Gestational Age (Fetus B)

LMP:           22w 6d        Date:  05/28/15                 EDD:   03/03/16
U/S Today:     22w 2d                                        EDD:   03/07/16
Best:          22w 6d     Det. By:  LMP  (05/28/15)          EDD:   03/03/16
Anatomy (Fetus B)

Cranium:               Appears normal         Aortic Arch:            Appears normal
Cavum:                 Appears normal         Ductal Arch:            Not well visualized
Ventricles:            Appears normal         Diaphragm:              Appears normal
Choroid Plexus:        Appears normal         Stomach:                Appears normal, left
sided
Cerebellum:            Appears normal         Abdomen:                Appears normal
Posterior Fossa:       Appears normal         Abdominal Wall:         Appears nml (cord
insert, abd wall)
Nuchal Fold:           Not applicable (>20    Cord Vessels:           Appears normal (3
wks GA)                                        vessel cord)
Face:                  Appears normal         Kidneys:                Appear normal
(orbits and profile)
Lips:                  Appears normal         Bladder:                Appears normal
Thoracic:              Appears normal         Spine:                  Appears normal
Heart:                 Appears normal         Upper Extremities:      Appears normal
(4CH, axis, and situs
RVOT:                  Not well visualized    Lower Extremities:      Appears normal
LVOT:                  Not well visualized

Other:  Fetus appears to be a female.  Technically difficult due to fetal
position.
Cervix Uterus Adnexa

Cervix
Length:            3.6  cm.
Normal appearance by transabdominal scan.

Uterus
No abnormality visualized.

Left Ovary
Not visualized.

Right Ovary
Within normal limits.
Impression

DC/DA twin gestation at 22w 6d

Twin A:
Breech presention, posterior placenta, maternal right
Limited views of the fetal heart noted (outflow tracts)
The remainder of the fetal anatomy appears normal
Normal amniotic fluid volume
The estimated fetal weight is at the 43rd %tile

Twin B:
Variable presentation, posterior placenta
Limited views of the fetal heart obtained (outflow tracts,
ductal arch)
The remainder of the fetal anatomy appears normal
Normal amniotic fluid volume
The estimated fetal weight is at the 31st %tile
Recommendations

Recommend follow up ultrasound for interval growth in 4
weeks; serial ultrasounds for growth every 4 weeks thereafter

## 2018-03-17 IMAGING — US US MFM UA CORD DOPPLER
1 series · 12 of 28 positions shown · non-contrast
Comparison: none

[Series 1: us mfm ua cord doppler · 47 acquisitions, 12 frames shown]
[im 2/47]
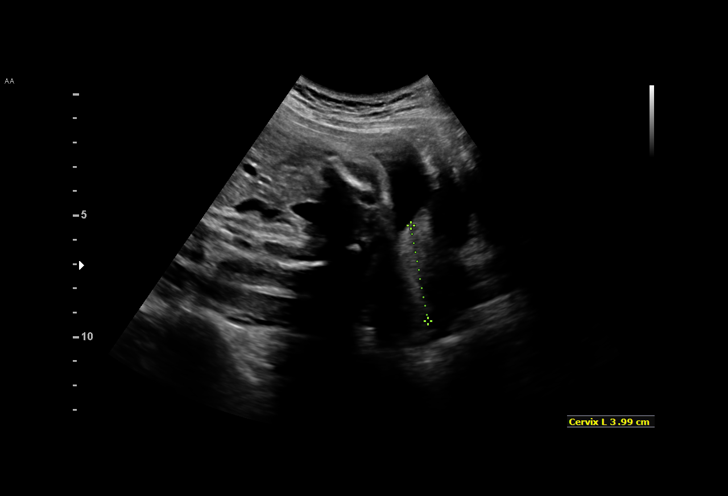
[im 6/47]
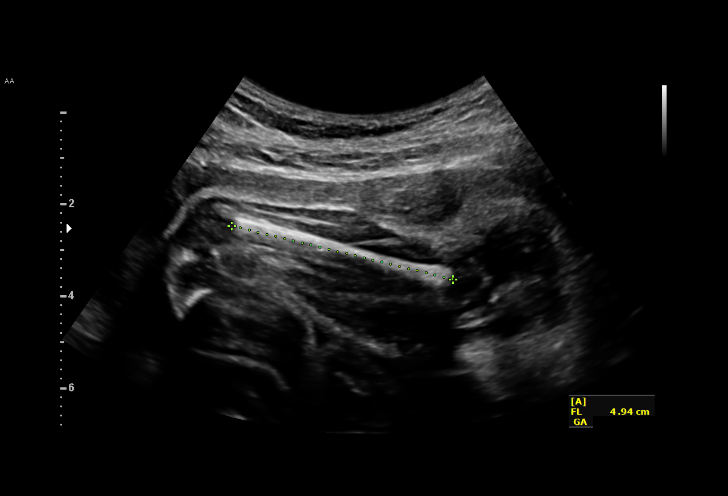
[im 9/47]
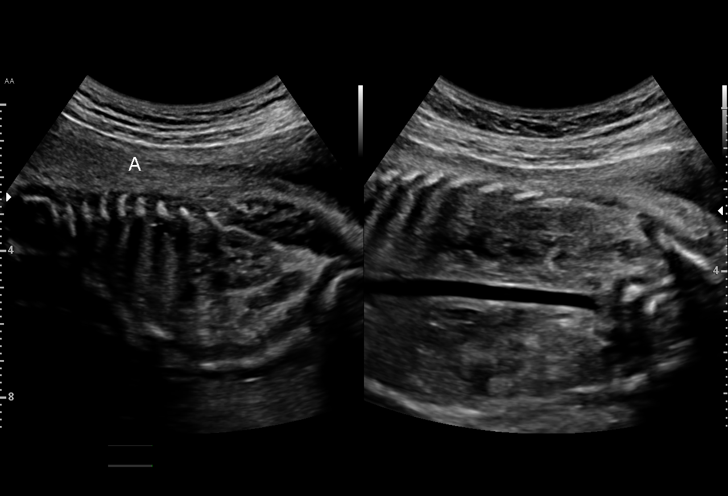
[im 14/47]
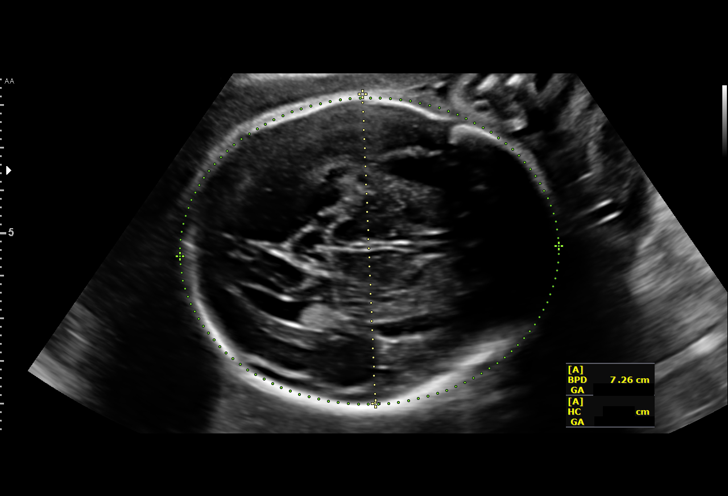
[im 18/47]
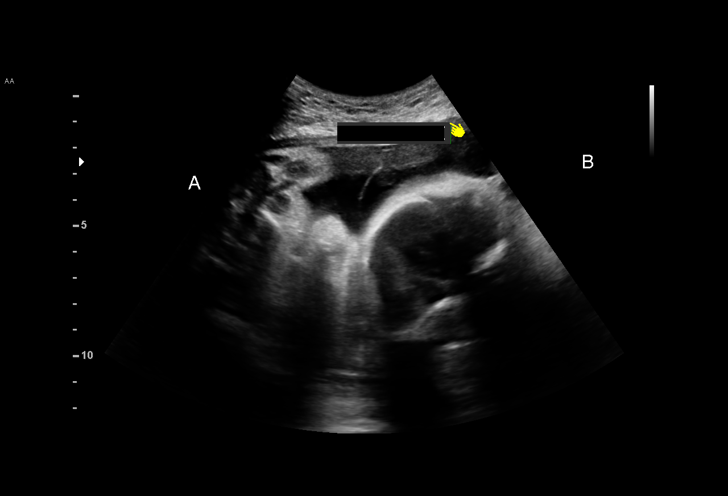
[im 21/47]
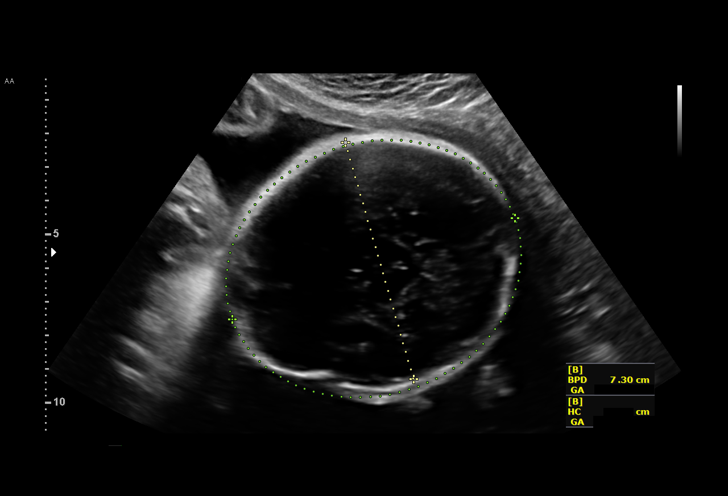
[im 26/47]
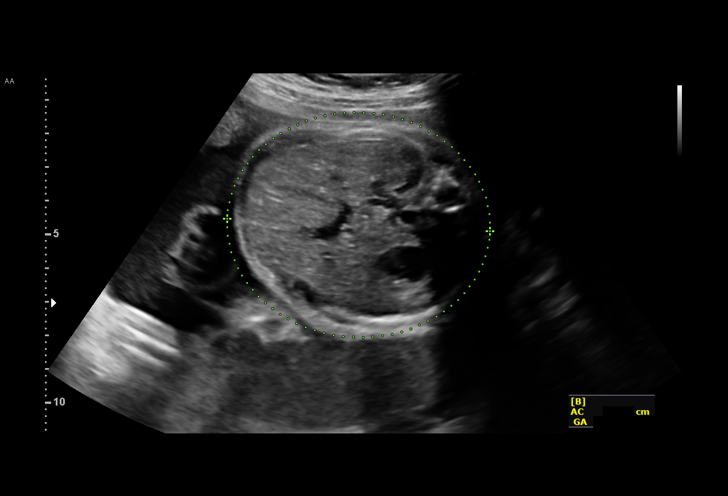
[im 29/47]
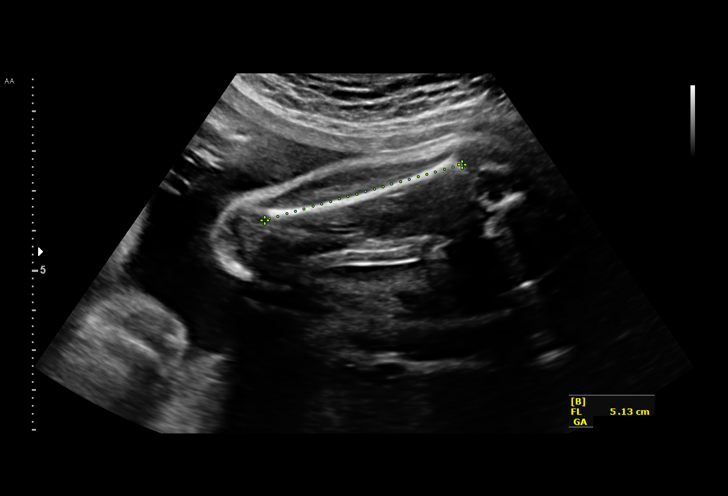
[im 33/47]
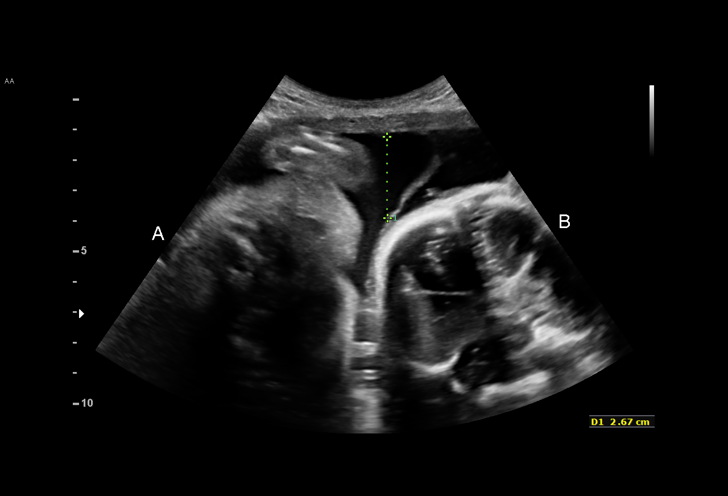
[im 38/47]
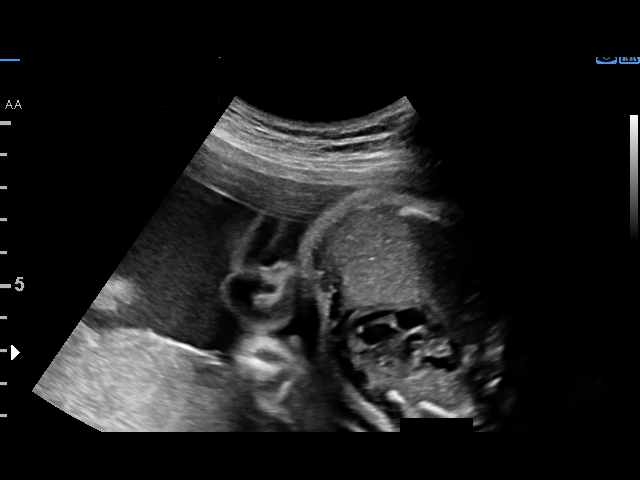
[im 41/47]
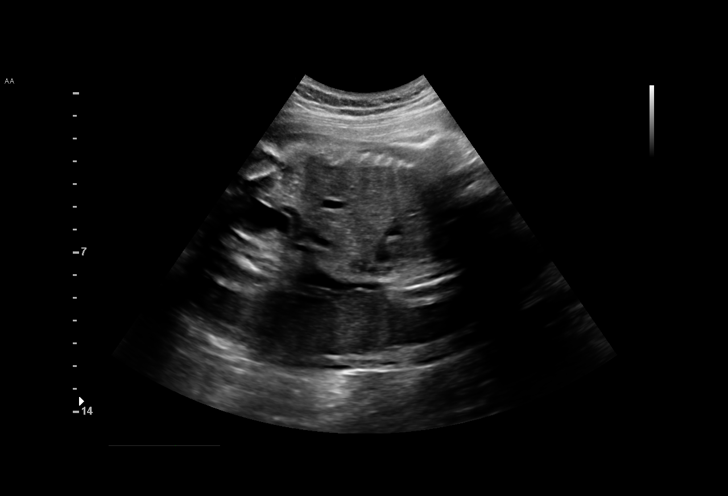
[im 45/47]
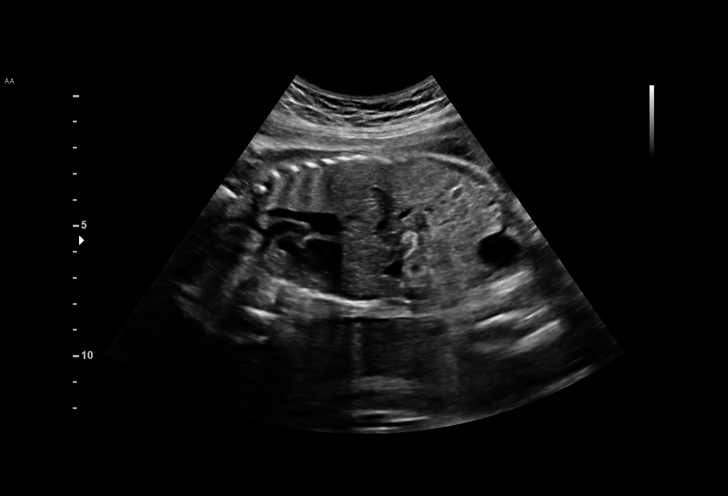

[12 of 28 positions shown; findings below may reference images not displayed]

pm)


1  LJUPKO TEAA              [id number]      [id number]     [id number]
2  LJUPKO TEAA              [id number]      [id number]     [id number]
3  LJUPKO TEAA              [id number]      [id number]     [id number]
Indications

28 weeks gestation of pregnancy
Twin pregnancy, di/di, second trimester
OB History

Gravidity:    1
Fetal Evaluation (Fetus A)

Num Of Fetuses:     2
Fetal Heart         143
Rate(bpm):
Cardiac Activity:   Observed
Fetal Lie:          Lower Fetus
Presentation:       Breech
Placenta:           Posterior, above cervical os
Membrane Desc:      Dividing Membrane seen - Dichorionic.

Amniotic Fluid
AFI FV:      Subjectively within normal limits
Biometry (Fetus A)

BPD:      71.5  mm     G. Age:  28w 5d         57  %    CI:        78.26   %    70 - 86
FL/HC:      19.3   %    18.8 -
HC:      255.7  mm     G. Age:  27w 5d         13  %    HC/AC:      1.15        1.05 -
AC:      222.6  mm     G. Age:  26w 5d          9  %    FL/BPD:     69.1   %    71 - 87
FL:       49.4  mm     G. Age:  26w 5d          6  %    FL/AC:      22.2   %    20 - 24

Est. FW:     998  gm      2 lb 3 oz     25  %     FW Discordancy         7  %
Gestational Age (Fetus A)

LMP:           28w 1d        Date:  05/28/15                 EDD:   03/03/16
U/S Today:     27w 3d                                        EDD:   03/08/16
Best:          28w 1d     Det. By:  LMP  (05/28/15)          EDD:   03/03/16
Anatomy (Fetus A)

Cranium:               Appears normal         Aortic Arch:            Previously seen
Cavum:                 Previously seen        Ductal Arch:            Previously seen
Ventricles:            Appears normal         Diaphragm:              Appears normal
Choroid Plexus:        Previously seen        Stomach:                Appears normal, left
sided
Cerebellum:            Previously seen        Abdomen:                Previously seen
Posterior Fossa:       Previously seen        Abdominal Wall:         Previously seen
Nuchal Fold:           Not applicable (>20    Cord Vessels:           Previously seen
wks GA)
Face:                  Orbits and profile     Kidneys:                Appear normal
previously seen
Lips:                  Previously seen        Bladder:                Appears normal
Thoracic:              Previously seen        Spine:                  Previously seen
Heart:                 Appears normal         Upper Extremities:      Previously seen
(4CH, axis, and situs
RVOT:                  Not well visualized    Lower Extremities:      Previously seen
LVOT:                  Not well visualized

Other:  Heels and 5th digit previously seen. Fetus appears to be a female.
Doppler - Fetal Vessels (Fetus A)

Umbilical Artery
S/D     %tile     RI              PI              PSV
(cm/s)
3.51       70

Fetal Evaluation (Fetus B)

Num Of Fetuses:     2
Preg. Location:     Intrauterine
Fetal Heart         153
Rate(bpm):
Cardiac Activity:   Observed
Fetal Lie:          Upper Fetus
Presentation:       Cephalic
Placenta:           Posterior, above cervical os
P. Cord Insertion:  Visualized, central

Amniotic Fluid
AFI FV:      Subjectively within normal limits
Biometry (Fetus B)

BPD:      73.1  mm     G. Age:  29w 2d         76  %    CI:        78.81   %    70 - 86
FL/HC:      19.5   %    18.8 -
HC:      260.4  mm     G. Age:  28w 2d         24  %    HC/AC:      1.15        1.05 -
AC:      227.3  mm     G. Age:  27w 1d         16  %    FL/BPD:     69.5   %    71 - 87
FL:       50.8  mm     G. Age:  27w 2d         14  %    FL/AC:      22.3   %    20 - 24

Est. FW:    2404  gm      2 lb 6 oz     35  %     FW Discordancy      0 \ 7 %
Gestational Age (Fetus B)

LMP:           28w 1d        Date:  05/28/15                 EDD:   03/03/16
U/S Today:     28w 0d                                        EDD:   03/04/16
Best:          28w 1d     Det. By:  LMP  (05/28/15)          EDD:   03/03/16
Anatomy (Fetus B)

Cranium:               Appears normal         LVOT:                   Not well visualized
Cavum:                 Previously seen        Aortic Arch:            Previously seen
Ventricles:            Appears normal         Ductal Arch:            Not well visualized
Choroid Plexus:        Previously seen        Diaphragm:              Appears normal
Cerebellum:            Previously seen        Stomach:                Appears normal, left
sided
Posterior Fossa:       Previously seen        Abdomen:                Appears normal
Nuchal Fold:           Previously seen        Abdominal Wall:         Previously seen
Face:                  Orbits and profile     Cord Vessels:           Previously seen
previously seen
Lips:                  Previously seen        Kidneys:                Appear normal
Palate:                Previously seen        Bladder:                Appears normal
Thoracic:              Previously seen        Spine:                  Previously seen
Heart:                 Previously seen        Upper Extremities:      Previously seen
RVOT:                  Not well visualized    Lower Extremities:      Previously seen

Other:  Female gender previously seen. Heels and 5th digit previously seen.
Cervix Uterus Adnexa

Cervix
Length:           3.99  cm.
Normal appearance by transabdominal scan.
Impression

DC/DA twin gestation at 28w 1d

Twin A:
Breech presention, posterior placenta, maternal right
Limited views of the outflow tracts again obtained
The remainder of the fetal anatomy appears normal
The estimated fetal weight is at the 25th %tile. The AC
measures at the 9th %tile (asymmetric growth)
UA Doppler studies normal for gestational age
Normal amniotic fluid volume

Twin B:
Cephalic presentation, posterior placenta
Limited views of the fetal heart obtained (outflow tracts,
ductal arch)
The remainder of the fetal anatomy appears normal
Normal amniotic fluid volume
The estimated fetal weight is at the 35th %tile
Recommendations

Ultrasound for interval growth in 3 weeks

## 2018-05-03 IMAGING — US US MFM OB FOLLOW-UP
1 series · 15 of 28 positions shown · non-contrast
Comparison: none

[Series 1: us mfm ob follow-up · 15 of 109 slices shown]
[im 1/109]
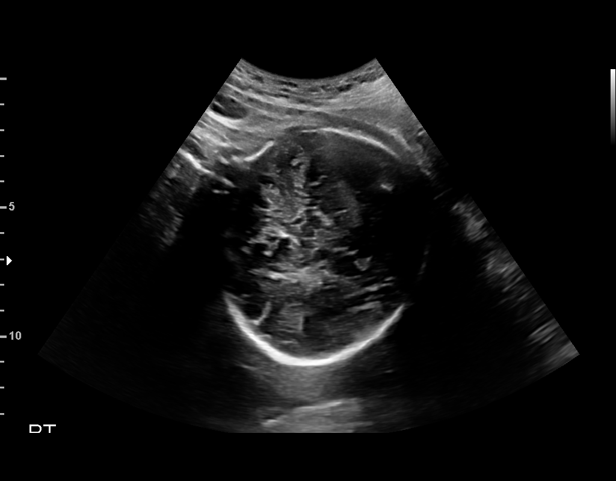
[im 9/109]
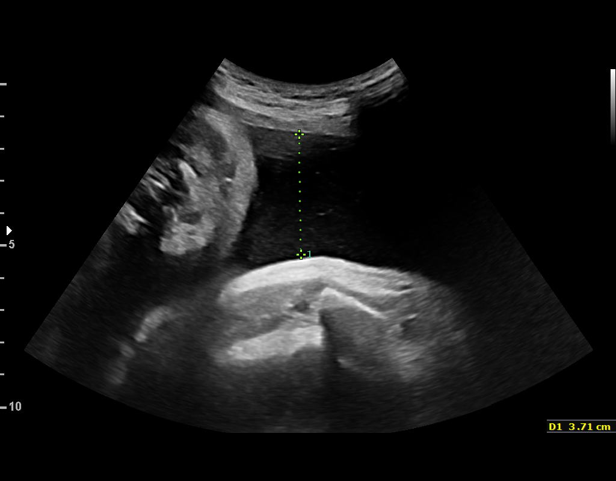
[im 17/109]
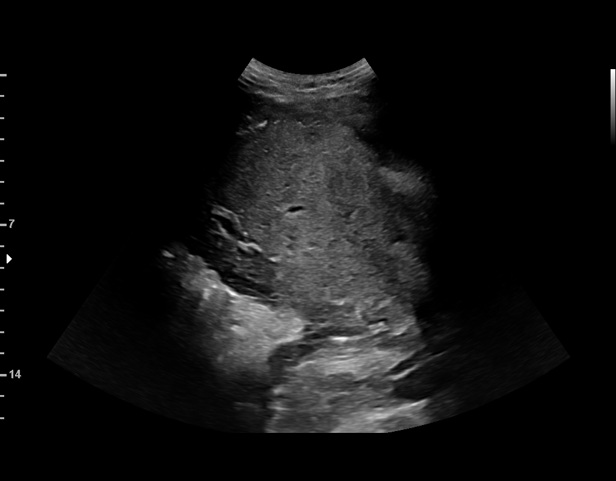
[im 25/109]
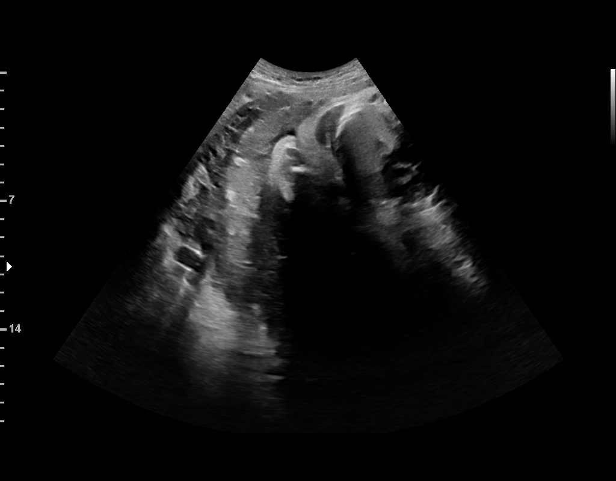
[im 33/109]
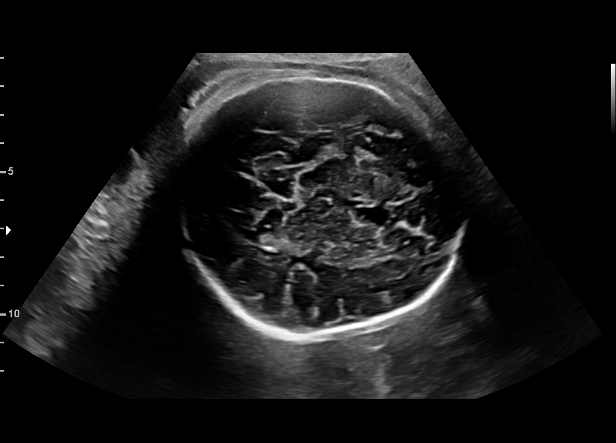
[im 41/109]
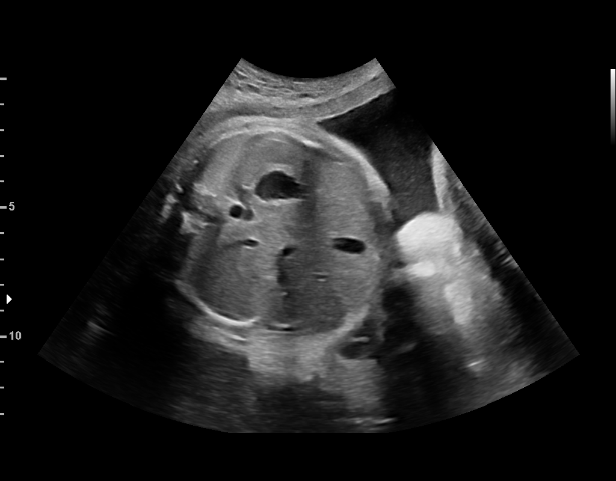
[im 49/109]
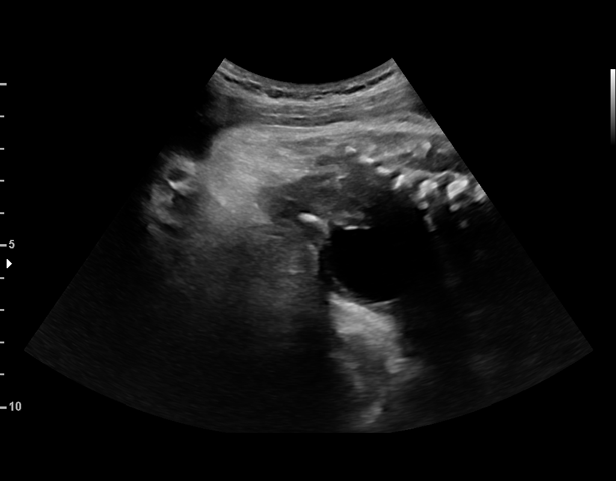
[im 57/109]
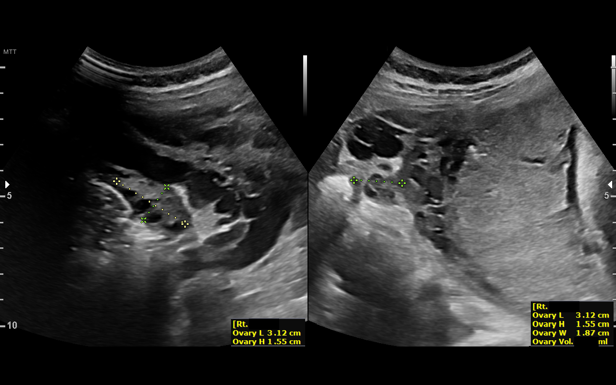
[im 61/109]
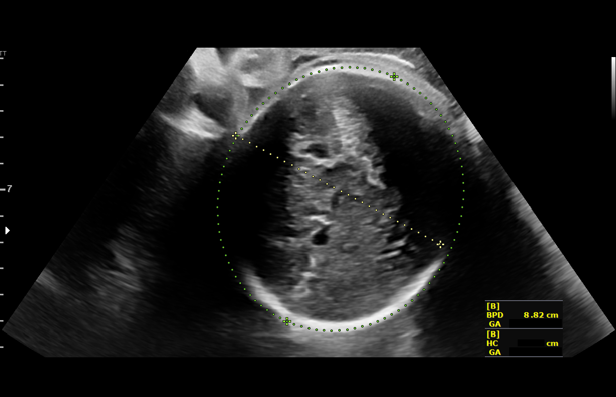
[im 69/109]
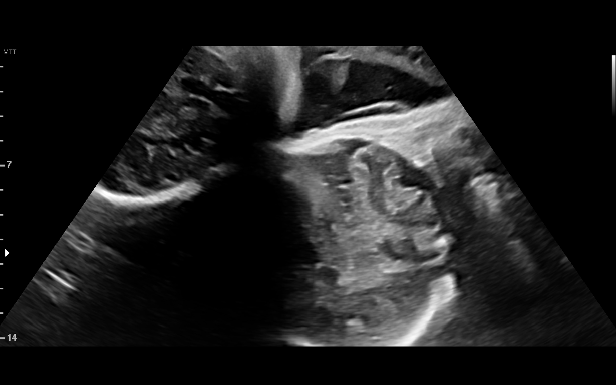
[im 77/109]
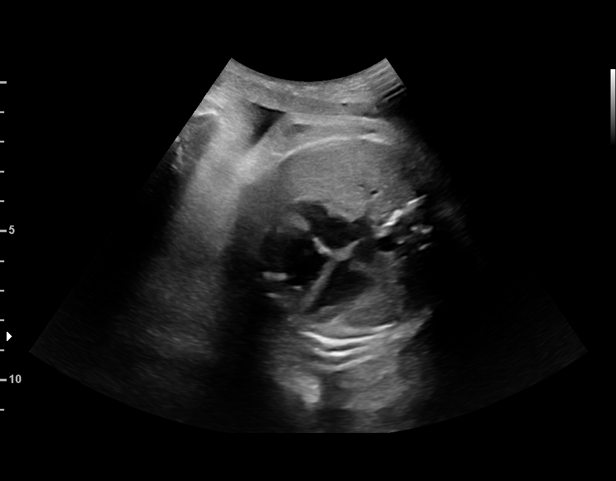
[im 85/109]
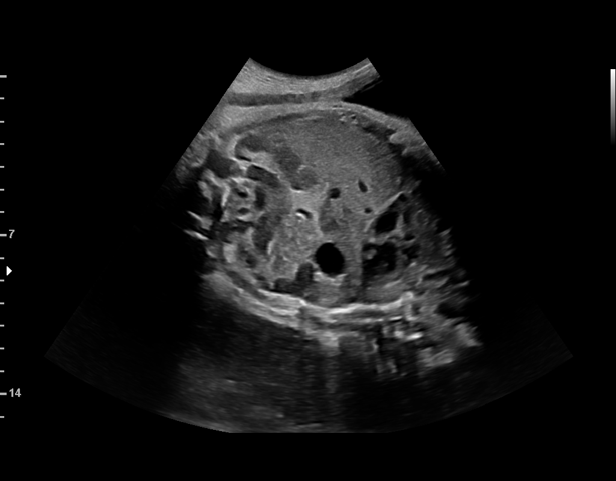
[im 93/109]
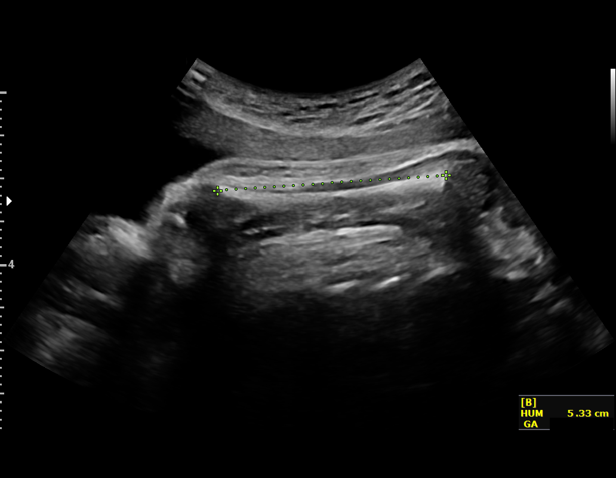
[im 101/109]
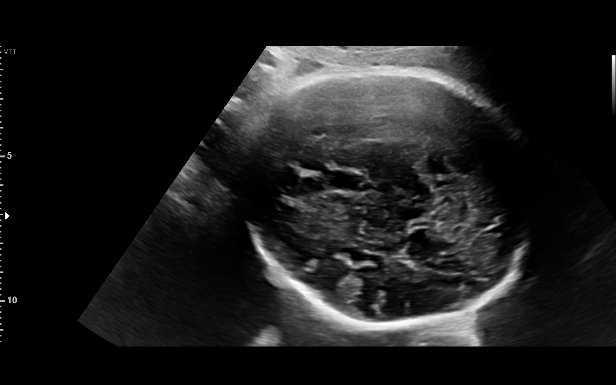
[im 109/109]
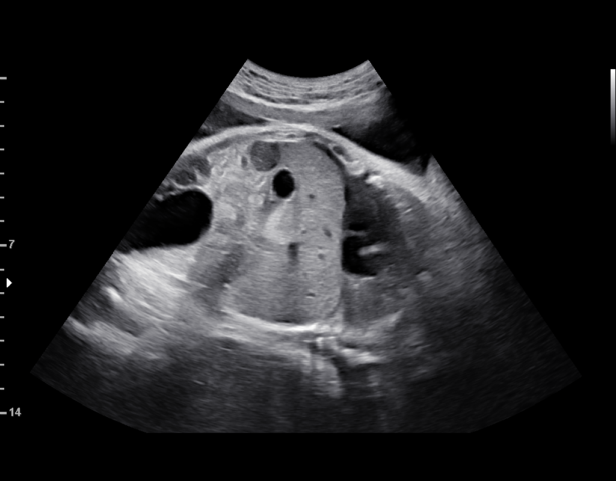

[15 of 28 positions shown; findings below may reference images not displayed]

1  NOHELY KB              [id number]      [id number]     [id number]
2  NOHELY KB              [id number]      [id number]     [id number]
Indications

34 weeks gestation of pregnancy
Twin pregnancy, di/di, third trimester
Encounter for other antenatal screening
follow-up
OB History

Gravidity:    1
Fetal Evaluation (Fetus A)

Num Of Fetuses:     2
Fetal Heart         135
Rate(bpm):
Cardiac Activity:   Observed
Fetal Lie:          Right Fetus
Presentation:       Cephalic
Placenta:           Posterior Right, above cervical os
P. Cord Insertion:  Previously Visualized
Membrane Desc:      Dividing Membrane seen - Dichorionic.

Amniotic Fluid
AFI FV:      Subjectively within normal limits

Largest Pocket(cm)
3.7
Biometry (Fetus A)
BPD:      87.6  mm     G. Age:  35w 3d         67  %    CI:        84.86   %    70 - 86
FL/HC:      20.5   %    20.1 -
HC:      299.6  mm     G. Age:  33w 2d        < 3  %    HC/AC:      1.01        0.93 -
AC:      296.7  mm     G. Age:  33w 5d         23  %    FL/BPD:     70.2   %    71 - 87
FL:       61.5  mm     G. Age:  31w 6d        < 3  %    FL/AC:      20.7   %    20 - 24
HUM:      52.5  mm     G. Age:  30w 4d        < 5  %
CER:      45.3  mm     G. Age:  N/A            91  %

Est. FW:    3415  gm    4 lb 12 oz      30  %     FW Discordancy        11  %
Gestational Age (Fetus A)

LMP:           34w 6d        Date:  05/28/15                 EDD:   03/03/16
U/S Today:     33w 4d                                        EDD:   03/12/16
Best:          34w 6d     Det. By:  LMP  (05/28/15)          EDD:   03/03/16
Anatomy (Fetus A)

Cranium:               Appears normal         Aortic Arch:            Previously seen
Cavum:                 Appears normal         Ductal Arch:            Previously seen
Ventricles:            Appears normal         Diaphragm:              Appears normal
Choroid Plexus:        Previously seen        Stomach:                Appears normal, left
sided
Cerebellum:            Appears normal         Abdomen:                Previously seen
Posterior Fossa:       Previously seen        Abdominal Wall:         Previously seen
Nuchal Fold:           Not applicable (>20    Cord Vessels:           Previously seen
wks GA)
Face:                  Orbits and profile     Kidneys:                Appear normal
previously seen
Lips:                  Previously seen        Bladder:                Appears normal
Thoracic:              Previously seen        Spine:                  Previously seen
Heart:                 Appears normal         Upper Extremities:      Previously seen
(4CH, axis, and situs
RVOT:                  Previously seen        Lower Extremities:      Previously seen
LVOT:                  Previously seen

Other:  Heels and 5th digit previously seen. Female gender previously seen.
Technically difficult due to advanced GA and fetal position.

Fetal Evaluation (Fetus B)

Num Of Fetuses:     2
Fetal Heart         137
Rate(bpm):
Cardiac Activity:   Observed
Fetal Lie:          Left Fetus
Presentation:       Cephalic
Placenta:           Posterior Fundal, above cervical os
P. Cord Insertion:  Previously Visualized
Membrane Desc:      Dividing Membrane seen - Dichorionic.

Amniotic Fluid
AFI FV:      Subjectively within normal limits

Largest Pocket(cm)
4.6
Biometry (Fetus B)
BPD:      87.5  mm     G. Age:  35w 2d         66  %    CI:        80.14   %    70 - 86
FL/HC:      20.4   %    20.1 -
HC:      308.8  mm     G. Age:  34w 3d         12  %    HC/AC:      0.99        0.93 -
AC:      310.9  mm     G. Age:  35w 0d         61  %    FL/BPD:     72.1   %    71 - 87
FL:       63.1  mm     G. Age:  32w 5d          4  %    FL/AC:      20.3   %    20 - 24
HUM:      53.3  mm     G. Age:  31w 0d        < 5  %
CER:      46.6  mm     G. Age:  N/A          > 95  %

Est. FW:    1112  gm      5 lb 5 oz     51  %     FW Discordancy     0 \ 11 %
Gestational Age (Fetus B)

LMP:           34w 6d        Date:  05/28/15                 EDD:   03/03/16
U/S Today:     34w 3d                                        EDD:   03/06/16
Best:          34w 6d     Det. By:  LMP  (05/28/15)          EDD:   03/03/16
Anatomy (Fetus B)

Cranium:               Appears normal         Aortic Arch:            Previously seen
Cavum:                 Previously seen        Ductal Arch:            Previously seen
Ventricles:            Appears normal         Diaphragm:              Appears normal
Choroid Plexus:        Previously seen        Stomach:                Appears normal, left
sided
Cerebellum:            Appears normal         Abdomen:                Previously seen
Posterior Fossa:       Previously seen        Abdominal Wall:         Previously seen
Nuchal Fold:           Not applicable (>20    Cord Vessels:           Previously seen
wks GA)
Face:                  Orbits and profile     Kidneys:                Appear normal
previously seen
Lips:                  Previously seen        Bladder:                Appears normal
Thoracic:              Previously seen        Spine:                  Previously seen
Heart:                 Appears normal         Upper Extremities:      Previously seen
(4CH, axis, and situs
RVOT:                  Previously seen        Lower Extremities:      Previously seen
LVOT:                  Previously seen

Other:  Female gender previously seen. Heels and 5th digit previously seen.
Technically difficult due to advanced GA and fetal position.
Cervix Uterus Adnexa

Cervix
Not visualized (advanced GA >69wks)

Uterus
No abnormality visualized.

Left Ovary
Size(cm)     2.97   x   1.54   x  1.55      Vol(ml):
Within normal limits. No adnexal mass visualized.

Right Ovary
Size(cm)     3.12   x   1.87   x  1.55      Vol(ml):
Within normal limits. No adnexal mass visualized.

Cul De Sac:   No free fluid seen.

Adnexa:       No abnormality visualized.
Impression

Diamniotic Dichorionic intrauterine pregnancy at weeks
Presentation is cephalic/cephalic
Normal amniotic fluid in each sac, with  MVP of  3.7 cm for A
and 4.6 cm for B
Estimated fetal weights are 2166g for A and 2410g for B,
30th and 51st  percentiles respectively. Discordance is 11%
Review of anatomy shows no evidence of structural
anomalies or sonographic markers for aneuploidy in either
twin
Recommendations

Given normal growth and development no further US
appointments are necassary in MFM. Follow-up ultrasounds
as clinically indicated.
